# Patient Record
Sex: Male | Born: 1990 | Race: White | Hispanic: No | Marital: Single | Smoking: Never smoker
Health system: Southern US, Community
[De-identification: ages and names within clinical notes are randomized; demographics above are authoritative.]

## PROBLEM LIST (undated history)

## (undated) DIAGNOSIS — E7800 Pure hypercholesterolemia, unspecified: Secondary | ICD-10-CM

## (undated) DIAGNOSIS — J45909 Unspecified asthma, uncomplicated: Secondary | ICD-10-CM

## (undated) DIAGNOSIS — I10 Essential (primary) hypertension: Secondary | ICD-10-CM

## (undated) DIAGNOSIS — R480 Dyslexia and alexia: Secondary | ICD-10-CM

## (undated) DIAGNOSIS — R278 Other lack of coordination: Secondary | ICD-10-CM

## (undated) DIAGNOSIS — F988 Other specified behavioral and emotional disorders with onset usually occurring in childhood and adolescence: Secondary | ICD-10-CM

## (undated) HISTORY — DX: Pure hypercholesterolemia, unspecified: E78.00

## (undated) HISTORY — DX: Other specified behavioral and emotional disorders with onset usually occurring in childhood and adolescence: F98.8

## (undated) HISTORY — PX: MOUTH SURGERY: SHX715

## (undated) HISTORY — DX: Dyslexia and alexia: R48.0

## (undated) HISTORY — DX: Essential (primary) hypertension: I10

## (undated) HISTORY — DX: Other lack of coordination: R27.8

## (undated) HISTORY — DX: Unspecified asthma, uncomplicated: J45.909

---
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
## 2011-06-11 LAB — RAPID URINE DRUG SCREEN, HOSP PERFORMED
Opiates: NOT DETECTED
Tetrahydrocannabinol: NOT DETECTED

## 2015-12-21 DIAGNOSIS — F4323 Adjustment disorder with mixed anxiety and depressed mood: Secondary | ICD-10-CM | POA: Diagnosis not present

## 2015-12-28 DIAGNOSIS — F4323 Adjustment disorder with mixed anxiety and depressed mood: Secondary | ICD-10-CM | POA: Diagnosis not present

## 2016-01-25 DIAGNOSIS — F4323 Adjustment disorder with mixed anxiety and depressed mood: Secondary | ICD-10-CM | POA: Diagnosis not present

## 2016-02-09 DIAGNOSIS — F4323 Adjustment disorder with mixed anxiety and depressed mood: Secondary | ICD-10-CM | POA: Diagnosis not present

## 2016-02-15 DIAGNOSIS — F988 Other specified behavioral and emotional disorders with onset usually occurring in childhood and adolescence: Secondary | ICD-10-CM | POA: Diagnosis not present

## 2016-02-23 DIAGNOSIS — F4323 Adjustment disorder with mixed anxiety and depressed mood: Secondary | ICD-10-CM | POA: Diagnosis not present

## 2016-03-15 DIAGNOSIS — F4323 Adjustment disorder with mixed anxiety and depressed mood: Secondary | ICD-10-CM | POA: Diagnosis not present

## 2016-04-10 DIAGNOSIS — F4323 Adjustment disorder with mixed anxiety and depressed mood: Secondary | ICD-10-CM | POA: Diagnosis not present

## 2016-04-24 DIAGNOSIS — F4323 Adjustment disorder with mixed anxiety and depressed mood: Secondary | ICD-10-CM | POA: Diagnosis not present

## 2016-05-17 DIAGNOSIS — F4323 Adjustment disorder with mixed anxiety and depressed mood: Secondary | ICD-10-CM | POA: Diagnosis not present

## 2016-06-14 DIAGNOSIS — F4323 Adjustment disorder with mixed anxiety and depressed mood: Secondary | ICD-10-CM | POA: Diagnosis not present

## 2016-07-04 ENCOUNTER — Telehealth: Payer: Self-pay

## 2016-07-04 NOTE — Telephone Encounter (Signed)
Records placed in your folder for review. Pt has NP appt with you tomorrow.

## 2016-07-05 ENCOUNTER — Encounter: Payer: Self-pay | Admitting: Medical

## 2016-07-05 ENCOUNTER — Ambulatory Visit (INDEPENDENT_AMBULATORY_CARE_PROVIDER_SITE_OTHER): Payer: BLUE CROSS/BLUE SHIELD | Admitting: Medical

## 2016-07-05 VITALS — BP 116/72 | HR 122 | Temp 98.1°F | Ht 70.0 in | Wt 213.4 lb

## 2016-07-05 DIAGNOSIS — F988 Other specified behavioral and emotional disorders with onset usually occurring in childhood and adolescence: Secondary | ICD-10-CM

## 2016-07-05 DIAGNOSIS — R000 Tachycardia, unspecified: Secondary | ICD-10-CM | POA: Diagnosis not present

## 2016-07-05 DIAGNOSIS — F4323 Adjustment disorder with mixed anxiety and depressed mood: Secondary | ICD-10-CM | POA: Diagnosis not present

## 2016-07-05 MED ORDER — AMPHETAMINE-DEXTROAMPHET ER 30 MG PO CP24: 60.0000 mg | ORAL_CAPSULE | Freq: Every day | ORAL | 0 refills | Status: DC

## 2016-07-05 NOTE — Progress Notes (Signed)
Subjective: Chief Complaint  Patient presents with  . New Patient (Initial Visit)    establish care for Adderall management   Here as a new patient.  Saw Dr. Rae LipsShuler for pediatrician, then Dr. Rosezetta SchlatterBurnette until he retired retired recently. Wanted to change providers as he didn't like the office management and front office staff, rudeness.  Anthony Pena, his friend and patient of Dr. Susann GivensLalonde here recommend him come here.    He notes he was originally diagnosed with ADD as probably age 89 or 25yo ,by Dr. Jane CanarySharpless here in [place].  Has been on various medication over the years.   Was on Ritalin at 25yo.  Took Ritalin for years.  Changed to concertra in middle school, was on Focalin for a period of time.  Switched to Adderall in high school, did better than all prior medication.   Been on current regimen of Adderall XR 30mg  2 tablets daily in the morning, and occasionally uses regular release 10mg  for extra long days or assignments.   Does hasn't changed in 7-8 years.  Only heart disease in family is PGF with CHF.  He uses fit bit and denies regularly tachycardia.  Pulse usually in the 75bpm region.    Coffee in the morning.    otherwise no c/o.  Is a businessman, does e commerce with mattress and other.    Past Medical History:  Diagnosis Date  . ADD (attention deficit disorder)    No current outpatient prescriptions on file prior to visit.   No current facility-administered medications on file prior to visit.     ROS as in subjective   Objective: BP 116/72 (BP Location: Right Arm, Patient Position: Sitting, Cuff Size: Large)   Pulse (!) 122   Temp 98.1 F (36.7 C) (Oral)   Ht 5\' 10"  (1.778 m)   Wt 213 lb 6.4 oz (96.8 kg)   SpO2 98%   BMI 30.62 kg/m   General appearance: alert, no distress, WD/WN,  Neck: supple, no lymphadenopathy, no thyromegaly, no masses Heart: RRR, normal S1, S2, no murmurs Lungs: CTA bilaterally, no wheezes, rhonchi, or rales Pulses: 2+ symmetric, upper and  lower extremities, normal cap refill Ext: no edema    Assessment: Encounter Diagnoses  Name Primary?  . Attention deficit disorder, unspecified hyperactivity presence Yes  . Tachycardia     Plan Reviewed prior records,for now c/t same medication but discussed possibly trying something lower dose in the future particularly if he continues to have tachycardia. Plan physical and labs and EKG next visit.    [name] was seen today for new patient (initial visit).  Diagnoses and all orders for this visit:  Attention deficit disorder, unspecified hyperactivity presence  Tachycardia  Other orders -     Discontinue: amphetamine-dextroamphetamine (ADDERALL XR) 30 MG 24 hr capsule; Take 2 capsules (60 mg total) by mouth daily. -     amphetamine-dextroamphetamine (ADDERALL XR) 30 MG 24 hr capsule; Take 2 capsules (60 mg total) by mouth daily. -     amphetamine-dextroamphetamine (ADDERALL XR) 30 MG 24 hr capsule; Take 2 capsules (60 mg total) by mouth daily.

## 2016-08-07 DIAGNOSIS — F4323 Adjustment disorder with mixed anxiety and depressed mood: Secondary | ICD-10-CM | POA: Diagnosis not present

## 2016-09-24 ENCOUNTER — Telehealth: Payer: Self-pay | Admitting: Medical

## 2016-09-24 NOTE — Telephone Encounter (Addendum)
P.A. ADDERALL XR submitted online

## 2016-09-24 NOTE — Telephone Encounter (Signed)
Also called [employer] t# [phone] to follow up & was advised that also information was received and should have decision by 09/26/16.  Left message for pt

## 2016-09-25 NOTE — Telephone Encounter (Signed)
P.A. Approved til 09/25/19, Pt informed. Faxed pharmacy

## 2016-10-25 ENCOUNTER — Telehealth: Payer: Self-pay | Admitting: Medical

## 2016-10-25 NOTE — Telephone Encounter (Signed)
Pt called for refills of adderall/call [phone] when ready

## 2016-10-26 ENCOUNTER — Other Ambulatory Visit: Payer: Self-pay | Admitting: Medical

## 2016-10-26 MED ORDER — AMPHETAMINE-DEXTROAMPHET ER 30 MG PO CP24: 60.0000 mg | ORAL_CAPSULE | Freq: Every day | ORAL | 0 refills | Status: DC

## 2016-10-26 NOTE — Telephone Encounter (Signed)
We have just seen for initial visit.  Get him back in for f/u.  If needed, can make this a physical visit.  5mo supply printed

## 2016-10-29 NOTE — Telephone Encounter (Signed)
Lm script ready for pick up

## 2016-11-21 ENCOUNTER — Ambulatory Visit (INDEPENDENT_AMBULATORY_CARE_PROVIDER_SITE_OTHER): Payer: BLUE CROSS/BLUE SHIELD | Admitting: Medical

## 2016-11-21 ENCOUNTER — Encounter: Payer: Self-pay | Admitting: Medical

## 2016-11-21 VITALS — BP 130/88 | HR 106 | Wt 218.8 lb

## 2016-11-21 DIAGNOSIS — Z79899 Other long term (current) drug therapy: Secondary | ICD-10-CM | POA: Insufficient documentation

## 2016-11-21 DIAGNOSIS — F988 Other specified behavioral and emotional disorders with onset usually occurring in childhood and adolescence: Secondary | ICD-10-CM

## 2016-11-21 DIAGNOSIS — R000 Tachycardia, unspecified: Secondary | ICD-10-CM | POA: Diagnosis not present

## 2016-11-21 MED ORDER — AMPHETAMINE-DEXTROAMPHET ER 30 MG PO CP24: 60.0000 mg | ORAL_CAPSULE | Freq: Every day | ORAL | 0 refills | Status: DC
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
## 2017-01-30 ENCOUNTER — Telehealth: Payer: Self-pay | Admitting: Family Medicine

## 2017-01-30 NOTE — Telephone Encounter (Signed)
Pt came in on march 7 and received Adderall XR refills. The last one he received should have been dated for 01/21/2017. It was written in error for 02/21/2017. He needs one written for may. [facility] [facility] to [facility] being out. Please call pt at [phone] when ready.

## 2017-01-31 MED ORDER — AMPHETAMINE-DEXTROAMPHET ER 30 MG PO CP24: 60.0000 mg | ORAL_CAPSULE | Freq: Every day | ORAL | 0 refills | Status: DC

## 2017-03-13 ENCOUNTER — Encounter (HOSPITAL_COMMUNITY): Payer: Self-pay | Admitting: Emergency Medicine

## 2017-03-13 ENCOUNTER — Emergency Department (HOSPITAL_COMMUNITY)
Admission: EM | Admit: 2017-03-13 | Discharge: 2017-03-13 | Disposition: A | Discharge status: Home / Self Care | Payer: BLUE CROSS/BLUE SHIELD | Attending: Emergency Medicine | Admitting: Emergency Medicine

## 2017-03-13 DIAGNOSIS — Y939 Activity, unspecified: Secondary | ICD-10-CM | POA: Insufficient documentation

## 2017-03-13 DIAGNOSIS — T310 Burns involving less than 10% of body surface: Secondary | ICD-10-CM | POA: Diagnosis not present

## 2017-03-13 DIAGNOSIS — Z79899 Other long term (current) drug therapy: Secondary | ICD-10-CM | POA: Insufficient documentation

## 2017-03-13 DIAGNOSIS — Y999 Unspecified external cause status: Secondary | ICD-10-CM | POA: Insufficient documentation

## 2017-03-13 DIAGNOSIS — T24291A Burn of second degree of multiple sites of right lower limb, except ankle and foot, initial encounter: Secondary | ICD-10-CM | POA: Diagnosis not present

## 2017-03-13 DIAGNOSIS — T24231A Burn of second degree of right lower leg, initial encounter: Secondary | ICD-10-CM | POA: Diagnosis not present

## 2017-03-13 DIAGNOSIS — Y929 Unspecified place or not applicable: Secondary | ICD-10-CM | POA: Insufficient documentation

## 2017-03-13 DIAGNOSIS — X58XXXA Exposure to other specified factors, initial encounter: Secondary | ICD-10-CM | POA: Diagnosis not present

## 2017-03-13 DIAGNOSIS — S79821A Other specified injuries of right thigh, initial encounter: Secondary | ICD-10-CM | POA: Diagnosis not present

## 2017-03-13 MED ORDER — SILVER SULFADIAZINE 1 % EX CREA: 1.0000 "application " | TOPICAL_CREAM | Freq: Every day | CUTANEOUS | 0 refills | Status: DC

## 2017-03-13 MED ORDER — SILVER SULFADIAZINE 1 % EX CREA
TOPICAL_CREAM | Freq: Every day | CUTANEOUS | Status: DC
  Administered 2017-03-13: 17:00:00 via TOPICAL
  Filled 2017-03-13: qty 85
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
## 2017-03-13 NOTE — ED Notes (Signed)
Wound cleansed 

## 2017-03-13 NOTE — Discharge Instructions (Signed)
Keep wound clean - wash daily Apply Silvadene cream 1-2 times daily and apply a new dressing Follow up with Dr. Adrienne MochaSanger-Dillingham (Plastics) Return for worsening symptoms
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
## 2017-03-22 DIAGNOSIS — T24211A Burn of second degree of right thigh, initial encounter: Secondary | ICD-10-CM | POA: Diagnosis not present

## 2017-03-26 DIAGNOSIS — T24211A Burn of second degree of right thigh, initial encounter: Secondary | ICD-10-CM | POA: Diagnosis not present

## 2017-04-01 DIAGNOSIS — T24211A Burn of second degree of right thigh, initial encounter: Secondary | ICD-10-CM | POA: Diagnosis not present

## 2017-04-02 ENCOUNTER — Other Ambulatory Visit: Payer: Self-pay | Admitting: Medical

## 2017-04-02 ENCOUNTER — Telehealth: Payer: Self-pay | Admitting: Medical

## 2017-04-02 MED ORDER — AMPHETAMINE-DEXTROAMPHET ER 30 MG PO CP24: 60.0000 mg | ORAL_CAPSULE | Freq: Every day | ORAL | 0 refills | Status: DC

## 2017-04-02 NOTE — Telephone Encounter (Signed)
rx ready for 2 mo supply.  Scheduled physical and lab visit for about 74mo from now (partly due to monitor elevated heart rate).

## 2017-04-02 NOTE — Telephone Encounter (Signed)
Pt requesting refill on Adderall. Call pt at [phone] when script is ready for pick up

## 2017-05-29 ENCOUNTER — Encounter: Payer: BLUE CROSS/BLUE SHIELD | Admitting: Medical

## 2017-05-30 ENCOUNTER — Encounter: Payer: Self-pay | Admitting: Medical

## 2017-06-10 ENCOUNTER — Telehealth: Payer: Self-pay | Admitting: Medical

## 2017-06-10 ENCOUNTER — Other Ambulatory Visit: Payer: Self-pay | Admitting: Medical

## 2017-06-10 MED ORDER — AMPHETAMINE-DEXTROAMPHET ER 30 MG PO CP24: 60.0000 mg | ORAL_CAPSULE | Freq: Every day | ORAL | 0 refills | Status: DC

## 2017-06-10 NOTE — Telephone Encounter (Signed)
rx ready 

## 2017-06-10 NOTE — Telephone Encounter (Signed)
Pt called and is requesting a refill on his adderrall pt made a cpe appt for 07-03-2017, he wants to know if you would please right him a a month supply till his appt, pt can be reached at [phone]

## 2017-06-10 NOTE — Telephone Encounter (Signed)
Informed pt that rx was ready to be picked up °

## 2017-07-03 ENCOUNTER — Encounter: Payer: Self-pay | Admitting: Medical

## 2017-07-03 ENCOUNTER — Ambulatory Visit (INDEPENDENT_AMBULATORY_CARE_PROVIDER_SITE_OTHER): Payer: BLUE CROSS/BLUE SHIELD | Admitting: Medical

## 2017-07-03 VITALS — BP 124/90 | HR 101 | Ht 69.5 in | Wt 214.8 lb

## 2017-07-03 DIAGNOSIS — Z113 Encounter for screening for infections with a predominantly sexual mode of transmission: Secondary | ICD-10-CM | POA: Diagnosis not present

## 2017-07-03 DIAGNOSIS — F988 Other specified behavioral and emotional disorders with onset usually occurring in childhood and adolescence: Secondary | ICD-10-CM

## 2017-07-03 DIAGNOSIS — Z0000 Encounter for general adult medical examination without abnormal findings: Secondary | ICD-10-CM

## 2017-07-03 DIAGNOSIS — R000 Tachycardia, unspecified: Secondary | ICD-10-CM

## 2017-07-03 DIAGNOSIS — Z79899 Other long term (current) drug therapy: Secondary | ICD-10-CM

## 2017-07-03 DIAGNOSIS — R030 Elevated blood-pressure reading, without diagnosis of hypertension: Secondary | ICD-10-CM | POA: Diagnosis not present

## 2017-07-03 MED ORDER — AMPHETAMINE-DEXTROAMPHET ER 30 MG PO CP24: 60.0000 mg | ORAL_CAPSULE | Freq: Every day | ORAL | 0 refills | Status: DC

## 2017-07-03 NOTE — Patient Instructions (Addendum)
Please monitor your pulse and blood pressures, write them down, and fax me a list of the readings in a few weeks.      Health Maintenance, Male A healthy lifestyle and preventive care is important for your health and wellness. Ask your health care provider about what schedule of regular examinations is right for you. What should I know about weight and diet? Eat a Healthy Diet  Eat plenty of vegetables, fruits, whole grains, low-fat dairy products, and lean protein.  Do not eat a lot of foods high in solid fats, added sugars, or salt.  Maintain a Healthy Weight Regular exercise can help you achieve or maintain a healthy weight. You should:  Do at least 150 minutes of exercise each week. The exercise should increase your heart rate and make you sweat (moderate-intensity exercise).  Do strength-training exercises at least twice a week.  Watch Your Levels of Cholesterol and Blood Lipids  Have your blood tested for lipids and cholesterol every 5 years starting at 28 years of age. If you are at high risk for heart disease, you should start having your blood tested when you are 28 years old. You may need to have your cholesterol levels checked more often if: ? Your lipid or cholesterol levels are high. ? You are older than 28 years of age. ? You are at high risk for heart disease.  What should I know about cancer screening? Many types of cancers can be detected early and may often be prevented. Lung Cancer  You should be screened every year for lung cancer if: ? You are a current smoker who has smoked for at least 30 years. ? You are a former smoker who has quit within the past 15 years.  Talk to your health care provider about your screening options, when you should start screening, and how often you should be screened.  Colorectal Cancer  Routine colorectal cancer screening usually begins at 28 years of age and should be repeated every 5-10 years until you are 28 years old. You may  need to be screened more often if early forms of precancerous polyps or small growths are found. Your health care provider may recommend screening at an earlier age if you have risk factors for colon cancer.  Your health care provider may recommend using home test kits to check for hidden blood in the stool.  A small camera at the end of a tube can be used to examine your colon (sigmoidoscopy or colonoscopy). This checks for the earliest forms of colorectal cancer.  Prostate and Testicular Cancer  Depending on your age and overall health, your health care provider may do certain tests to screen for prostate and testicular cancer.  Talk to your health care provider about any symptoms or concerns you have about testicular or prostate cancer.  Skin Cancer  Check your skin from head to toe regularly.  Tell your health care provider about any new moles or changes in moles, especially if: ? There is a change in a mole's size, shape, or color. ? You have a mole that is larger than a pencil eraser.  Always use sunscreen. Apply sunscreen liberally and repeat throughout the day.  Protect yourself by wearing long sleeves, pants, a wide-brimmed hat, and sunglasses when outside.  What should I know about heart disease, diabetes, and high blood pressure?  If you are 4718-28 years of age, have your blood pressure checked every 3-5 years. If you are 28 years of age or  older, have your blood pressure checked every year. You should have your blood pressure measured twice-once when you are at a hospital or clinic, and once when you are not at a hospital or clinic. Record the average of the two measurements. To check your blood pressure when you are not at a hospital or clinic, you can use: ? An automated blood pressure machine at a pharmacy. ? A home blood pressure monitor.  Talk to your health care provider about your target blood pressure.  If you are between 66-58 years old, ask your health care  provider if you should take aspirin to prevent heart disease.  Have regular diabetes screenings by checking your fasting blood sugar level. ? If you are at a normal weight and have a low risk for diabetes, have this test once every three years after the age of 65. ? If you are overweight and have a high risk for diabetes, consider being tested at a younger age or more often.  A one-time screening for abdominal aortic aneurysm (AAA) by ultrasound is recommended for men aged 5-75 years who are current or former smokers. What should I know about preventing infection? Hepatitis B If you have a higher risk for hepatitis B, you should be screened for this virus. Talk with your health care provider to find out if you are at risk for hepatitis B infection. Hepatitis C Blood testing is recommended for:  Everyone born from [birth year] through 1967.  Anyone with known risk factors for hepatitis C.  Sexually Transmitted Diseases (STDs)  You should be screened each year for STDs including gonorrhea and chlamydia if: ? You are sexually active and are younger than 28 years of age. ? You are older than 28 years of age and your health care provider tells you that you are at risk for this type of infection. ? Your sexual activity has changed since you were last screened and you are at an increased risk for chlamydia or gonorrhea. Ask your health care provider if you are at risk.  Talk with your health care provider about whether you are at high risk of being infected with HIV. Your health care provider may recommend a prescription medicine to help prevent HIV infection.  What else can I do?  Schedule regular health, dental, and eye exams.  Stay current with your vaccines (immunizations).  Do not use any tobacco products, such as cigarettes, chewing tobacco, and e-cigarettes. If you need help quitting, ask your health care provider.  Limit alcohol intake to no more than 2 drinks per day. One drink equals 12  ounces of beer, 5 ounces of wine, or 1 ounces of hard liquor.  Do not use street drugs.  Do not share needles.  Ask your health care provider for help if you need support or information about quitting drugs.  Tell your health care provider if you often feel depressed.  Tell your health care provider if you have ever been abused or do not feel safe at home. This information is not intended to replace advice given to you by your health care provider. Make sure you discuss any questions you have with your health care provider. Document Released: 03/01/2008 Document Revised: 05/02/2016 Document Reviewed: 06/07/2015 Elsevier Interactive Patient Education  [employer].
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
## 2017-07-04 LAB — CBC WITH DIFFERENTIAL/PLATELET
BASOS ABS: 41 {cells}/uL (ref 0–200)
BASOS PCT: 0.8 %
EOS ABS: 102 {cells}/uL (ref 15–500)
EOS PCT: 2 %
HCT: 48.1 % (ref 38.5–50.0)
Hemoglobin: 17.1 g/dL (ref 13.2–17.1)
Lymphs Abs: 1030 cells/uL (ref 850–3900)
MCH: 32.6 pg (ref 27.0–33.0)
MCHC: 35.6 g/dL (ref 32.0–36.0)
MCV: 91.8 fL (ref 80.0–100.0)
MONOS PCT: 9.1 %
MPV: 9.8 fL (ref 7.5–12.5)
Neutro Abs: 3463 cells/uL (ref 1500–7800)
Neutrophils Relative %: 67.9 %
PLATELETS: 323 10*3/uL (ref 140–400)
RBC: 5.24 10*6/uL (ref 4.20–5.80)
RDW: 12 % (ref 11.0–15.0)
TOTAL LYMPHOCYTE: 20.2 %
WBC mixed population: 464 cells/uL (ref 200–950)
WBC: 5.1 10*3/uL (ref 3.8–10.8)

## 2017-07-04 LAB — COMPREHENSIVE METABOLIC PANEL
AG RATIO: 2 (calc) (ref 1.0–2.5)
ALT: 66 U/L — ABNORMAL HIGH (ref 9–46)
AST: 34 U/L (ref 10–40)
Albumin: 4.7 g/dL (ref 3.6–5.1)
Alkaline phosphatase (APISO): 61 U/L (ref 40–115)
BUN: 12 mg/dL (ref 7–25)
CHLORIDE: 100 mmol/L (ref 98–110)
CO2: 28 mmol/L (ref 20–32)
CREATININE: 0.92 mg/dL (ref 0.60–1.35)
Calcium: 9.8 mg/dL (ref 8.6–10.3)
GLOBULIN: 2.3 g/dL (ref 1.9–3.7)
GLUCOSE: 97 mg/dL (ref 65–99)
Potassium: 4.2 mmol/L (ref 3.5–5.3)
Sodium: 137 mmol/L (ref 135–146)
TOTAL PROTEIN: 7 g/dL (ref 6.1–8.1)
Total Bilirubin: 0.9 mg/dL (ref 0.2–1.2)

## 2017-07-04 LAB — C. TRACHOMATIS/N. GONORRHOEAE RNA
C. TRACHOMATIS RNA, TMA: NOT DETECTED
N. gonorrhoeae RNA, TMA: NOT DETECTED

## 2017-07-04 LAB — LIPID PANEL
CHOL/HDL RATIO: 4.6 (calc) (ref <5.0)
Cholesterol: 215 mg/dL — ABNORMAL HIGH (ref <200)
HDL: 47 mg/dL (ref >40)
LDL Cholesterol (Calc): 145 mg/dL (calc) — ABNORMAL HIGH
NON-HDL CHOLESTEROL (CALC): 168 mg/dL — AB (ref <130)
Triglycerides: 117 mg/dL (ref <150)

## 2017-07-04 LAB — TSH: TSH: 3.23 m[IU]/L (ref 0.40–4.50)

## 2017-07-04 LAB — RPR: RPR: NONREACTIVE

## 2017-07-04 LAB — HIV ANTIBODY (ROUTINE TESTING W REFLEX): HIV: NONREACTIVE

## 2017-09-24 ENCOUNTER — Ambulatory Visit: Payer: Self-pay | Admitting: Medical

## 2017-10-01 ENCOUNTER — Telehealth: Payer: Self-pay | Admitting: Medical

## 2017-10-01 NOTE — Telephone Encounter (Signed)
Pt called and is needing a refill on his adderall pt uses [facility] [phone] - [place], [place] - [address] [facility] [address] pt can be reached at [phone]

## 2017-10-01 NOTE — Telephone Encounter (Signed)
Needs appt.    From his 06/2017 physical , his pulse and BPs continued to be elevated.   He was advised to record and monitor these and bring in the numbers upon recheck visit, particularly in light of his medications that can contribute to the elevated pulse for example.     He apparently NO SHOWED the other day.   Generall with ADD medication we see people in f/u typically every 3 - 6 months depending upon how stable they are on the medication or whether or not there are any other conditions we are monitoring such as the pulse/BP.

## 2017-10-02 ENCOUNTER — Other Ambulatory Visit: Payer: Self-pay | Admitting: Medical

## 2017-10-02 MED ORDER — AMPHETAMINE-DEXTROAMPHET ER 30 MG PO CP24: 60.0000 mg | ORAL_CAPSULE | Freq: Every day | ORAL | 0 refills | Status: DC

## 2017-10-02 NOTE — Telephone Encounter (Signed)
Refill x 19mo ready, make sure he gets 2nd no show notice

## 2017-10-02 NOTE — Telephone Encounter (Signed)
Called  Pt made him an appt  For 10/24/2017 , he was not able to come any sooner because he is out of town because  Work.

## 2017-10-09 ENCOUNTER — Encounter: Payer: Self-pay | Admitting: Medical

## 2017-10-09 NOTE — Telephone Encounter (Signed)
2nd no show letter sent °

## 2017-10-13 ENCOUNTER — Telehealth: Payer: Self-pay | Admitting: Medical

## 2017-10-13 NOTE — Telephone Encounter (Signed)
P.A. ADDERALL XR  

## 2017-10-24 ENCOUNTER — Encounter: Payer: Self-pay | Admitting: Medical

## 2017-10-29 ENCOUNTER — Encounter: Payer: Self-pay | Admitting: Medical

## 2017-10-29 ENCOUNTER — Ambulatory Visit: Payer: BLUE CROSS/BLUE SHIELD | Admitting: Medical

## 2017-10-29 VITALS — BP 132/72 | HR 79 | Wt 219.6 lb

## 2017-10-29 DIAGNOSIS — F988 Other specified behavioral and emotional disorders with onset usually occurring in childhood and adolescence: Secondary | ICD-10-CM

## 2017-10-29 DIAGNOSIS — R000 Tachycardia, unspecified: Secondary | ICD-10-CM

## 2017-10-29 DIAGNOSIS — R7989 Other specified abnormal findings of blood chemistry: Secondary | ICD-10-CM | POA: Insufficient documentation

## 2017-10-29 DIAGNOSIS — Z79899 Other long term (current) drug therapy: Secondary | ICD-10-CM | POA: Diagnosis not present

## 2017-10-29 DIAGNOSIS — R945 Abnormal results of liver function studies: Secondary | ICD-10-CM | POA: Diagnosis not present

## 2017-10-29 DIAGNOSIS — R030 Elevated blood-pressure reading, without diagnosis of hypertension: Secondary | ICD-10-CM

## 2017-10-29 MED ORDER — AMPHETAMINE-DEXTROAMPHET ER 30 MG PO CP24: 60.0000 mg | ORAL_CAPSULE | Freq: Every day | ORAL | 0 refills | Status: DC

## 2017-10-29 MED ORDER — AMPHETAMINE-DEXTROAMPHETAMINE 10 MG PO TABS: 10.0000 mg | ORAL_TABLET | Freq: Every day | ORAL | 0 refills | Status: DC

## 2017-10-29 MED ORDER — AMPHETAMINE-DEXTROAMPHET ER 30 MG PO CP24
60.0000 mg | ORAL_CAPSULE | Freq: Every day | ORAL | 0 refills | Status: DC
Start: 2017-12-27 — End: 2018-01-28

## 2017-10-29 NOTE — Assessment & Plan Note (Signed)
Improved today.  Continue to monitor.

## 2017-10-29 NOTE — Patient Instructions (Signed)
Encounter Diagnoses  Name Primary?  . Elevated blood-pressure reading without diagnosis of hypertension Yes  . Tachycardia   . High risk medication use   . Attention deficit disorder, unspecified hyperactivity presence     Glad to see your blood pressure and pulse was normal today!  Try to get exercise such as walking, biking, or running most days per week.  Work on ab crunches and back strengthening exercising as well as stretching routine daily  Limit salt, fast food, fried food, processed foods and junk food  Try and limit alcohol to 1-2 drinks or less on any given day.  Plan to return for lab visit in 4 months at your convenience.   I recommend a healthy diet.    Do's:   whole grains such as whole grain pasta, rice, whole grains breads and whole grain cereals.  Use small quantities such as 1/2 cup per serving or 2 slices of bread per serving.    Eat 3-5 fruits daily  Eat beans at least once daily  Eat almonds in small quantities at least 3 days per week    If they eat meat, I recommend small portions of lean meats such as chicken, fish, and [place].  Eat as much NON corn and NON potato vegetables as they like, particularly raw or steamed  Drink several large glasses of water daily  Cautions:  Limit red meat  Limit corn and potatoes  Limit sweets, cake, pie, candy  Limit beer and alcohol  Avoid fried food, fast food, large portions  Avoid sugary drinks such as regular soda and sweet tea

## 2017-10-29 NOTE — Assessment & Plan Note (Signed)
Improved.  He has been working on [employer] diet and exercise.

## 2017-10-29 NOTE — Progress Notes (Signed)
Subjective:  Anthony Pena is a 27 y.o. male who presents for med check. Chief Complaint  Patient presents with  . med check    med check n o other concerns , fasting for lab   At his last visit for physical in October, we discussed exam findings on several visits of elevated BP and pulse.   He is on ADD medication/stimulants.   He also was found to have elevated cholesterol and elevated liver tests last visit.   Last visit his dad had recently passed away, was not eating well.   Since last visit he has done a lot better on diet.    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   Has had some pain in shoulder blade, been seeing chiropractor.    Chiropractor is having him work on exercise and core strengthening.   Lately not able to do as much cardio.    Plans to do more in the summer.   No hx/o elevated liver test prior to last visit here, no hx/o hepatitis exposure.  No specific bodily fluid exposures on the job.   Last visit he was drinking heavily, has cut back on this, but typically drinks 1-2 drinks per night.  This past weekend was drinking more with friends.   He notes hx/o Hep B and Hep A vaccination.  Had uncle who died of hepatitis.     ADD - doing well on current medication but in the past his prior PCP had given 10mg  regular release Adderall that he would sometimes use on the weekends instead of the XR dosing.   He would like this for prn use.  A script would last months.    No other aggravating or relieving factors.    No other c/o.  The following portions of the patient's history were reviewed and updated as appropriate: allergies, current medications, past family history, past medical history, past social history, past surgical history and problem list.  ROS Otherwise as in subjective above    Objective BP 132/72   Pulse 79   Wt 219 lb 9.6 oz (99.6 kg)   SpO2 98%   BMI 31.96 kg/m   BP Readings from Last 3 Encounters:  10/29/17 132/72  07/03/17 124/90  03/13/17  (!) 156/100   Wt Readings from Last 3 Encounters:  10/29/17 219 lb 9.6 oz (99.6 kg)  07/03/17 214 lb 12.8 oz (97.4 kg)  11/21/16 218 lb 12.8 oz (99.2 kg)    General appearance: alert, no distress, WD/WN Psych: pleasant, good eye contact, answers questions appropriately    Assessment: Encounter Diagnoses  Name Primary?  . Elevated blood-pressure reading without diagnosis of hypertension Yes  . Tachycardia   . High risk medication use   . Attention deficit disorder, unspecified hyperactivity presence   . Elevated LFTs     Plan: Attention deficit disorder Discussed risks and benefits of medication Medication changes today: c/t Adderall XR, but can use regular release 10mg  prn in place of the XR dosing.  otherwise continue current regimen. I recommended them having a consistent day to day routine with work and household responsibilities  Counseled on health diet, limiting alcohol, caffeine, sugary drinks and avoid energy drinks Counseled on time management, setting realistic goals, using a calendar, organizational tools They have been advised to protect their medication, not let others use their medication, to use as directed and discussed, and that this type of medication is a controlled substance.  Thus, we will continue to see  them for routine medication check ups and monitoring.    Elevated blood-pressure reading without diagnosis of hypertension Improved.  He has been working on [employer]healthier diet and exercise.  Tachycardia Improved today.   Continue to monitor  Elevated LFTs We discussed the recent elevated liver test.  We discussed possible causes of elevated liver test including hepatitis infection, fatty liver disease, medication or drug induced hepatitis, autoimmune hepatitis,iron overload disease, other specific liver diseases.    They decline history of hepatitis exposure They do report regular alcohol use.  They deny heavy Tylenol use They deny history of IV drug  use They deny prior sexual contacts with HIV or hepatitis We discussed potential additional evaluation.  His mildly elevated liver tests are likely due to recent alcohol use.  Counseled on alcohol use, healthy diet, exercise, and we will plan to repeat the liver test in 3-4 months nurse visit.  [name] was seen today for med check.  Diagnoses and all orders for this visit:  Elevated blood-pressure reading without diagnosis of hypertension  Tachycardia  High risk medication use  Attention deficit disorder, unspecified hyperactivity presence  Elevated LFTs -     Hepatic function panel; Future  Other orders -     amphetamine-dextroamphetamine (ADDERALL XR) 30 MG 24 hr capsule; Take 2 capsules (60 mg total) by mouth daily. -     amphetamine-dextroamphetamine (ADDERALL XR) 30 MG 24 hr capsule; Take 2 capsules (60 mg total) by mouth daily. -     amphetamine-dextroamphetamine (ADDERALL XR) 30 MG 24 hr capsule; Take 2 capsules (60 mg total) by mouth daily. -     amphetamine-dextroamphetamine (ADDERALL) 10 MG tablet; Take 1 tablet (10 mg total) by mouth daily with breakfast.   Spent > 30 minutes face to face with patient in discussion of symptoms, evaluation, plan and recommendations.    Follow up: 51mo for lab/nurse visit

## 2017-10-29 NOTE — Assessment & Plan Note (Signed)
Discussed risks and benefits of medication Medication changes today: c/t Adderall XR, but can use regular release 10mg  prn in place of the XR dosing.  otherwise continue current regimen. I recommended them having a consistent day to day routine with work and household responsibilities  Counseled on health diet, limiting alcohol, caffeine, sugary drinks and avoid energy drinks Counseled on time management, setting realistic goals, using a calendar, organizational tools They have been advised to protect their medication, not let others use their medication, to use as directed and discussed, and that this type of medication is a controlled substance.  Thus, we will continue to see them for routine medication check ups and monitoring.

## 2017-10-29 NOTE — Assessment & Plan Note (Signed)
We discussed the recent elevated liver test.  We discussed possible causes of elevated liver test including hepatitis infection, fatty liver disease, medication or drug induced hepatitis, autoimmune hepatitis,iron overload disease, other specific liver diseases.    They decline history of hepatitis exposure They do report regular alcohol use.  They deny heavy Tylenol use They deny history of IV drug use They deny prior sexual contacts with HIV or hepatitis We discussed potential additional evaluation.  His mildly elevated liver tests are likely due to recent alcohol use.  Counseled on alcohol use, healthy diet, exercise, and we will plan to repeat the liver test in 3-4 months nurse visit.

## 2017-11-02 ENCOUNTER — Telehealth: Payer: Self-pay | Admitting: Medical

## 2017-11-02 NOTE — Telephone Encounter (Signed)
P.A. ADDERALL XR with pt's new ins

## 2017-11-02 NOTE — Telephone Encounter (Signed)
P.A. ADDERALL 10MG  with pt's new ins

## 2017-11-02 NOTE — Telephone Encounter (Signed)
P.A. Cancelled pt has new ins.

## 2017-11-03 NOTE — Telephone Encounter (Signed)
P.A. Approved til 09/25/19

## 2017-11-03 NOTE — Telephone Encounter (Signed)
P.A. Approved til 10/28/20

## 2018-01-23 DIAGNOSIS — M9902 Segmental and somatic dysfunction of thoracic region: Secondary | ICD-10-CM | POA: Diagnosis not present

## 2018-01-23 DIAGNOSIS — M609 Myositis, unspecified: Secondary | ICD-10-CM | POA: Diagnosis not present

## 2018-01-23 DIAGNOSIS — M62830 Muscle spasm of back: Secondary | ICD-10-CM | POA: Diagnosis not present

## 2018-01-23 DIAGNOSIS — M9904 Segmental and somatic dysfunction of sacral region: Secondary | ICD-10-CM | POA: Diagnosis not present

## 2018-01-28 ENCOUNTER — Other Ambulatory Visit: Payer: Self-pay | Admitting: Medical

## 2018-01-28 ENCOUNTER — Telehealth: Payer: Self-pay | Admitting: Medical

## 2018-01-28 DIAGNOSIS — M609 Myositis, unspecified: Secondary | ICD-10-CM | POA: Diagnosis not present

## 2018-01-28 DIAGNOSIS — M62830 Muscle spasm of back: Secondary | ICD-10-CM | POA: Diagnosis not present

## 2018-01-28 DIAGNOSIS — M9902 Segmental and somatic dysfunction of thoracic region: Secondary | ICD-10-CM | POA: Diagnosis not present

## 2018-01-28 DIAGNOSIS — M9904 Segmental and somatic dysfunction of sacral region: Secondary | ICD-10-CM | POA: Diagnosis not present

## 2018-01-28 MED ORDER — AMPHETAMINE-DEXTROAMPHET ER 30 MG PO CP24: 60.0000 mg | ORAL_CAPSULE | Freq: Every day | ORAL | 0 refills | Status: DC

## 2018-01-28 NOTE — Telephone Encounter (Signed)
Done, sent electronically

## 2018-01-28 NOTE — Telephone Encounter (Signed)
Pt called and Is requesting a refill on his adderall xr pt uses [id number] - [place], [place] - [address] AT [facility] OF [address] pt can be reached at [phone]

## 2018-01-29 NOTE — Telephone Encounter (Signed)
Called and informed pt that rx was sent to the pharmacy °

## 2018-03-04 ENCOUNTER — Telehealth: Payer: Self-pay | Admitting: Medical

## 2018-03-04 ENCOUNTER — Other Ambulatory Visit: Payer: Self-pay | Admitting: Medical

## 2018-03-04 MED ORDER — AMPHETAMINE-DEXTROAMPHET ER 30 MG PO CP24: 60.0000 mg | ORAL_CAPSULE | Freq: Every day | ORAL | 0 refills | Status: DC

## 2018-03-04 NOTE — Telephone Encounter (Signed)
Pt requesting refill on Adderall   

## 2018-04-01 ENCOUNTER — Other Ambulatory Visit: Payer: Self-pay | Admitting: Medical

## 2018-04-01 ENCOUNTER — Telehealth: Payer: Self-pay | Admitting: Medical

## 2018-04-01 MED ORDER — AMPHETAMINE-DEXTROAMPHET ER 30 MG PO CP24: 60.0000 mg | ORAL_CAPSULE | Freq: Every day | ORAL | 0 refills | Status: DC

## 2018-04-01 NOTE — Telephone Encounter (Signed)
Medication sent, make it recheck appointment on medications before next refill

## 2018-04-01 NOTE — Telephone Encounter (Signed)
Called and left voice mail that rx was sent to the pharmacy and that he needs a follow up on medications

## 2018-04-01 NOTE — Telephone Encounter (Signed)
Pt called and is requesting a refill on his adderall pt is needing them to be filled a little early he is going on vacation and is leaving to go out of town Wednesday 04/02/2018 until next week, [phone] - [place], [place] - [address] [facility] [address]

## 2018-04-25 ENCOUNTER — Ambulatory Visit: Payer: BLUE CROSS/BLUE SHIELD | Admitting: Medical

## 2018-04-25 VITALS — BP 140/90 | HR 100 | Temp 97.7°F | Resp 16 | Ht 71.0 in | Wt 210.8 lb

## 2018-04-25 DIAGNOSIS — R030 Elevated blood-pressure reading, without diagnosis of hypertension: Secondary | ICD-10-CM | POA: Diagnosis not present

## 2018-04-25 DIAGNOSIS — R7989 Other specified abnormal findings of blood chemistry: Secondary | ICD-10-CM

## 2018-04-25 DIAGNOSIS — R945 Abnormal results of liver function studies: Secondary | ICD-10-CM | POA: Diagnosis not present

## 2018-04-25 DIAGNOSIS — Z79899 Other long term (current) drug therapy: Secondary | ICD-10-CM | POA: Diagnosis not present

## 2018-04-25 DIAGNOSIS — F988 Other specified behavioral and emotional disorders with onset usually occurring in childhood and adolescence: Secondary | ICD-10-CM

## 2018-04-25 MED ORDER — AMPHETAMINE-DEXTROAMPHET ER 30 MG PO CP24: 60.0000 mg | ORAL_CAPSULE | Freq: Every day | ORAL | 0 refills | Status: DC

## 2018-04-25 NOTE — Patient Instructions (Addendum)
Check blood pressure and pulses at least 3 days per week for the next 2-3 weeks and call in or fax or e-mail numbers   Normal blood pressure is 120/70.   140/90 or higher is abnormal  Normal pulse should be in the 70-80 range resting.  >100 resting is abnormal   If your numbers are consistently in the abnormal range, we should consider medication such as Atenolol to lower those numbers   DASH Eating Plan DASH stands for "Dietary Approaches to Stop Hypertension." The DASH eating plan is a healthy eating plan that has been shown to reduce high blood pressure (hypertension). Additional health benefits may include reducing the risk of type 2 diabetes mellitus, heart disease, and stroke. The DASH eating plan may also help with weight loss. WHAT DO I NEED TO KNOW ABOUT THE DASH EATING PLAN? For the DASH eating plan, you will follow these general guidelines:  Choose foods with a percent daily value for sodium of less than 5% (as listed on the food label).  Use salt-free seasonings or herbs instead of table salt or sea salt.  Check with your health care provider or pharmacist before using salt substitutes.  Eat lower-sodium products, often labeled as "lower sodium" or "no salt added."  Eat fresh foods.  Eat more vegetables, fruits, and low-fat dairy products.  Choose whole grains. Look for the word "whole" as the first word in the ingredient list.  Choose fish and skinless chicken or [place] more often than red meat. Limit fish, poultry, and meat to 6 oz (170 g) each day.  Limit sweets, desserts, sugars, and sugary drinks.  Choose heart-healthy fats.  Limit cheese to 1 oz (28 g) per day.  Eat more home-cooked food and less restaurant, buffet, and fast food.  Limit fried foods.  Cook foods using methods other than frying.  Limit canned vegetables. If you do use them, rinse them well to decrease the sodium.  When eating at a restaurant, ask that your food be prepared with less  salt, or no salt if possible. WHAT FOODS CAN I EAT? Seek help from a dietitian for individual calorie needs. Grains Whole grain or whole wheat bread. Brown rice. Whole grain or whole wheat pasta. Quinoa, bulgur, and whole grain cereals. Low-sodium cereals. Corn or whole wheat flour tortillas. Whole grain cornbread. Whole grain crackers. Low-sodium crackers. Vegetables Fresh or frozen vegetables (raw, steamed, roasted, or grilled). Low-sodium or reduced-sodium tomato and vegetable juices. Low-sodium or reduced-sodium tomato sauce and paste. Low-sodium or reduced-sodium canned vegetables.  Fruits All fresh, canned (in natural juice), or frozen fruits. Meat and Other Protein Products Ground beef (85% or leaner), grass-fed beef, or beef trimmed of fat. Skinless chicken or [place]. Ground chicken or [place]. Pork trimmed of fat. All fish and seafood. Eggs. Dried beans, peas, or lentils. Unsalted nuts and seeds. Unsalted canned beans. Dairy Low-fat dairy products, such as skim or 1% milk, 2% or reduced-fat cheeses, low-fat ricotta or cottage cheese, or plain low-fat yogurt. Low-sodium or reduced-sodium cheeses. Fats and Oils Tub margarines without trans fats. Light or reduced-fat mayonnaise and salad dressings (reduced sodium). Avocado. Safflower, olive, or canola oils. Natural peanut or almond butter. Other Unsalted popcorn and pretzels. The items listed above may not be a complete list of recommended foods or beverages. Contact your dietitian for more options. WHAT FOODS ARE NOT RECOMMENDED? Grains White bread. White pasta. White rice. Refined cornbread. Bagels and croissants. Crackers that contain trans fat. Vegetables Creamed or fried vegetables. Vegetables in  a cheese sauce. Regular canned vegetables. Regular canned tomato sauce and paste. Regular tomato and vegetable juices. Fruits Dried fruits. Canned fruit in light or heavy syrup. Fruit juice. Meat and Other Protein Products Fatty cuts of  meat. Ribs, chicken wings, bacon, sausage, bologna, salami, chitterlings, fatback, hot dogs, bratwurst, and packaged luncheon meats. Salted nuts and seeds. Canned beans with salt. Dairy Whole or 2% milk, cream, half-and-half, and cream cheese. Whole-fat or sweetened yogurt. Full-fat cheeses or blue cheese. Nondairy creamers and whipped toppings. Processed cheese, cheese spreads, or cheese curds. Condiments Onion and garlic salt, seasoned salt, table salt, and sea salt. Canned and packaged gravies. Worcestershire sauce. Tartar sauce. Barbecue sauce. Teriyaki sauce. Soy sauce, including reduced sodium. Steak sauce. Fish sauce. Oyster sauce. Cocktail sauce. Horseradish. Ketchup and mustard. Meat flavorings and tenderizers. Bouillon cubes. Hot sauce. Tabasco sauce. Marinades. Taco seasonings. Relishes. Fats and Oils Butter, stick margarine, lard, shortening, ghee, and bacon fat. Coconut, palm kernel, or palm oils. Regular salad dressings. Other Pickles and olives. Salted popcorn and pretzels. The items listed above may not be a complete list of foods and beverages to avoid. Contact your dietitian for more information. WHERE CAN I FIND MORE INFORMATION? National Heart, Lung, and Blood Institute: [employer] Document Released: 08/23/2011 Document Revised: 01/18/2014 Document Reviewed: 07/08/2013 [facility] Patient Information 2015 [place], [place]. This information is not intended to replace advice given to you by your health care provider. Make sure you discuss any questions you have with your health care provider.        Why follow it? Research shows. . Those who follow the Mediterranean diet have a reduced risk of heart disease  . The diet is associated with a reduced incidence of Parkinson's and Alzheimer's diseases . People following the diet may have longer life expectancies and lower rates of chronic diseases  . The Dietary Guidelines for Americans  recommends the Mediterranean diet as an eating plan to promote health and prevent disease  What Is the Mediterranean Diet?  . Healthy eating plan based on typical foods and recipes of Mediterranean-style cooking . The diet is primarily a plant based diet; these foods should make up a majority of meals   Starches - Plant based foods should make up a majority of meals - They are an important sources of vitamins, minerals, energy, antioxidants, and fiber - Choose whole grains, foods high in fiber and minimally processed items  - Typical grain sources include wheat, oats, barley, corn, brown rice, bulgar, farro, millet, polenta, couscous  - Various types of beans include chickpeas, lentils, fava beans, black beans, white beans   Fruits  Veggies - Large quantities of antioxidant rich fruits & veggies; 6 or more servings  - Vegetables can be eaten raw or lightly drizzled with oil and cooked  - Vegetables common to the traditional Mediterranean Diet include: artichokes, arugula, beets, broccoli, brussel sprouts, cabbage, carrots, celery, collard greens, cucumbers, eggplant, kale, leeks, lemons, lettuce, mushrooms, okra, onions, peas, peppers, potatoes, pumpkin, radishes, rutabaga, shallots, spinach, sweet potatoes, turnips, zucchini - Fruits common to the Mediterranean Diet include: apples, apricots, avocados, cherries, clementines, dates, figs, grapefruits, grapes, melons, nectarines, oranges, peaches, pears, pomegranates, strawberries, tangerines  Fats - Replace butter and margarine with healthy oils, such as olive oil, canola oil, and tahini  - Limit nuts to no more than a handful a day  - Nuts include walnuts, almonds, pecans, pistachios, pine nuts  - Limit or avoid candied, honey roasted or heavily salted nuts - Olives are central  to the Mediterranean diet - can be eaten whole or used in a variety of dishes   Meats Protein - Limiting red meat: no more than a few times a month - When eating red  meat: choose lean cuts and keep the portion to the size of deck of cards - Eggs: approx. 0 to 4 times a week  - Fish and lean poultry: at least 2 a week  - Healthy protein sources include, chicken, [place], lean beef, lamb - Increase intake of seafood such as tuna, salmon, trout, mackerel, shrimp, scallops - Avoid or limit high fat processed meats such as sausage and bacon  Dairy - Include moderate amounts of low fat dairy products  - Focus on healthy dairy such as fat free yogurt, skim milk, low or reduced fat cheese - Limit dairy products higher in fat such as whole or 2% milk, cheese, ice cream  Alcohol - Moderate amounts of red wine is ok  - No more than 5 oz daily for women (all ages) and men older than age 29  - No more than 10 oz of wine daily for men younger than 6865  Other - Limit sweets and other desserts  - Use herbs and spices instead of salt to flavor foods  - Herbs and spices common to the traditional Mediterranean Diet include: basil, bay leaves, chives, cloves, cumin, fennel, garlic, lavender, marjoram, mint, oregano, parsley, pepper, rosemary, sage, savory, sumac, tarragon, thyme   It's not just a diet, it's a lifestyle:  . The Mediterranean diet includes lifestyle factors typical of those in the region  . Foods, drinks and meals are best eaten with others and savored . Daily physical activity is important for overall good health . This could be strenuous exercise like running and aerobics . This could also be more leisurely activities such as walking, housework, yard-work, or taking the stairs . Moderation is the key; a balanced and healthy diet accommodates most foods and drinks . Consider portion sizes and frequency of consumption of certain foods   Meal Ideas & Options:  . Breakfast:  o Whole wheat toast or whole wheat English muffins with peanut butter & hard boiled egg o Steel cut oats topped with apples & cinnamon and skim milk  o Fresh fruit: banana, strawberries,  melon, berries, peaches  o Smoothies: strawberries, bananas, greek yogurt, peanut butter o Low fat greek yogurt with blueberries and granola  o Egg white omelet with spinach and mushrooms o Breakfast couscous: whole wheat couscous, apricots, skim milk, cranberries  . Sandwiches:  o Hummus and grilled vegetables (peppers, zucchini, squash) on whole wheat bread   o Grilled chicken on whole wheat pita with lettuce, tomatoes, cucumbers or tzatziki  o Tuna salad on whole wheat bread: tuna salad made with greek yogurt, olives, red peppers, capers, green onions o Garlic rosemary lamb pita: lamb sauted with garlic, rosemary, salt & pepper; add lettuce, cucumber, greek yogurt to pita - flavor with lemon juice and black pepper  . Seafood:  o Mediterranean grilled salmon, seasoned with garlic, basil, parsley, lemon juice and black pepper o Shrimp, lemon, and spinach whole-grain pasta salad made with low fat greek yogurt  o Seared scallops with lemon orzo  o Seared tuna steaks seasoned salt, pepper, coriander topped with tomato mixture of olives, tomatoes, olive oil, minced garlic, parsley, green onions and cappers  . Meats:  o Herbed greek chicken salad with kalamata olives, cucumber, feta  o Red bell peppers stuffed with spinach, bulgur,  lean ground beef (or lentils) & topped with feta   o Kebabs: skewers of chicken, tomatoes, onions, zucchini, squash  o [place] burgers: made with red onions, mint, dill, lemon juice, feta cheese topped with roasted red peppers . Vegetarian o Cucumber salad: cucumbers, artichoke hearts, celery, red onion, feta cheese, tossed in olive oil & lemon juice  o Hummus and whole grain pita points with a greek salad (lettuce, tomato, feta, olives, cucumbers, red onion) o Lentil soup with celery, carrots made with vegetable broth, garlic, salt and pepper  o Tabouli salad: parsley, bulgur, mint, scallions, cucumbers, tomato, radishes, lemon juice, olive oil, salt and  pepper.

## 2018-04-25 NOTE — Progress Notes (Signed)
Subjective:  Anthony Pena is a 27 y.o. male who presents for med check. Chief Complaint  Patient presents with  . med check    med check    Here for med check.  ADD - doing well on current medication.  Using 30mg  XR adderall daily.  Work has been crazy stressful.     Hx/o elevated BP - checks occasionally, gets normal BP readings at home.  Pulse readings in 70-80s at rest, home readings.  Here for recheck on elevated liver tests from last year.  No hx/o elevated liver test prior to last visit here, no hx/o hepatitis exposure.  No specific bodily fluid exposures on the job.    Still drinks on average 1 drink per day, some weekends more.  He notes hx/o Hep B and Hep A vaccination.  Had uncle who died of hepatitis.     No other aggravating or relieving factors.    No other c/o.  The following portions of the patient's history were reviewed and updated as appropriate: allergies, current medications, past family history, past medical history, past social history, past surgical history and problem list.   ROS Otherwise as in subjective above  Past Medical History:  Diagnosis Date  . ADD (attention deficit disorder)   . Childhood asthma   . Dysgraphia    childhood  . Dyslexia    childhood   Current Outpatient Medications on File Prior to Visit  Medication Sig Dispense Refill  . amphetamine-dextroamphetamine (ADDERALL XR) 30 MG 24 hr capsule Take 2 capsules (60 mg total) by mouth daily. 60 capsule 0   No current facility-administered medications on file prior to visit.    Family History  Problem Relation Age of Onset  . Hypertension Mother   . Hypertension Father   . COPD Father   . Heart disease Father        CHF  . Diabetes Maternal Grandfather   . Cancer Maternal Grandfather        lung, colon  . Cancer Maternal Grandmother        breast  . Stroke Maternal Grandmother   . Heart disease Paternal Grandmother        CHF  . Cancer Paternal Grandfather        stomach       Objective BP 140/90   Pulse 100   Temp 97.7 F (36.5 C) (Oral)   Resp 16   Ht 5\' 11"  (1.803 m)   Wt 210 lb 12.8 oz (95.6 kg)   SpO2 98%   BMI 29.40 kg/m   BP Readings from Last 3 Encounters:  04/25/18 140/90  10/29/17 132/72  07/03/17 124/90   Wt Readings from Last 3 Encounters:  04/25/18 210 lb 12.8 oz (95.6 kg)  10/29/17 219 lb 9.6 oz (99.6 kg)  07/03/17 214 lb 12.8 oz (97.4 kg)    General appearance: alert, no distress, WD/WN Psych: pleasant, good eye contact, answers questions appropriately Heart RRR, normal s1, s2, no murmurs Lungs clear Ext: no edema Pulses 2+ UE and LE Abdomen: +bs, soft, nontender, no mass, no organomegaly    Assessment: Encounter Diagnoses  Name Primary?  . Attention deficit disorder, unspecified hyperactivity presence Yes  . Elevated blood-pressure reading without diagnosis of hypertension   . Elevated LFTs   . High risk medication use      Plan: ADD - counseled on risks/benefits of medication, c/t current medication  Elevated BP - advised he montior BPs and pulse at home and get  me readings in 3 weeks.  Counseled on DASH diet, limiting sodium, sleep hygiene.    Elevated LFTs - repeat lab today, counseled on limiting alcohol   Slade was seen today for med check.  Diagnoses and all orders for this visit:  Attention deficit disorder, unspecified hyperactivity presence  Elevated blood-pressure reading without diagnosis of hypertension  Elevated LFTs -     Hepatic function panel  High risk medication use  Other orders -     amphetamine-dextroamphetamine (ADDERALL XR) 30 MG 24 hr capsule; Take 2 capsules (60 mg total) by mouth daily.

## 2018-04-26 LAB — HEPATIC FUNCTION PANEL
ALBUMIN: 4.8 g/dL (ref 3.5–5.5)
ALT: 102 IU/L — ABNORMAL HIGH (ref 0–44)
AST: 44 IU/L — ABNORMAL HIGH (ref 0–40)
Alkaline Phosphatase: 69 IU/L (ref 39–117)
BILIRUBIN TOTAL: 0.5 mg/dL (ref 0.0–1.2)
BILIRUBIN, DIRECT: 0.15 mg/dL (ref 0.00–0.40)
TOTAL PROTEIN: 7.1 g/dL (ref 6.0–8.5)

## 2018-04-28 ENCOUNTER — Other Ambulatory Visit: Payer: Self-pay | Admitting: Medical

## 2018-04-28 DIAGNOSIS — R7989 Other specified abnormal findings of blood chemistry: Secondary | ICD-10-CM

## 2018-04-28 DIAGNOSIS — Z79899 Other long term (current) drug therapy: Secondary | ICD-10-CM

## 2018-04-28 DIAGNOSIS — R945 Abnormal results of liver function studies: Principal | ICD-10-CM

## 2018-04-28 NOTE — Progress Notes (Signed)
See lab

## 2018-04-29 ENCOUNTER — Other Ambulatory Visit: Payer: BLUE CROSS/BLUE SHIELD

## 2018-05-15 ENCOUNTER — Other Ambulatory Visit: Payer: Self-pay

## 2018-05-20 LAB — FERRITIN: FERRITIN: 588 ng/mL — AB (ref 30–400)

## 2018-05-20 LAB — HEPATITIS C ANTIBODY: HEP C VIRUS AB: 0.1 {s_co_ratio} (ref 0.0–0.9)

## 2018-05-20 LAB — IRON: Iron: 55 ug/dL (ref 38–169)

## 2018-05-20 LAB — SPECIMEN STATUS REPORT

## 2018-05-20 LAB — HEPATITIS B SURFACE ANTIGEN: Hepatitis B Surface Ag: NEGATIVE

## 2018-05-29 ENCOUNTER — Telehealth: Payer: Self-pay

## 2018-05-29 ENCOUNTER — Other Ambulatory Visit: Payer: Self-pay | Admitting: Medical

## 2018-05-29 ENCOUNTER — Ambulatory Visit
Admission: RE | Admit: 2018-05-29 | Discharge: 2018-05-29 | Disposition: A | Discharge status: Home / Self Care | Payer: BLUE CROSS/BLUE SHIELD | Source: Ambulatory Visit | Attending: Medical | Admitting: Medical

## 2018-05-29 DIAGNOSIS — Z79899 Other long term (current) drug therapy: Secondary | ICD-10-CM

## 2018-05-29 DIAGNOSIS — R7989 Other specified abnormal findings of blood chemistry: Secondary | ICD-10-CM

## 2018-05-29 DIAGNOSIS — K760 Fatty (change of) liver, not elsewhere classified: Secondary | ICD-10-CM | POA: Diagnosis not present

## 2018-05-29 DIAGNOSIS — R945 Abnormal results of liver function studies: Principal | ICD-10-CM

## 2018-05-29 MED ORDER — AMPHETAMINE-DEXTROAMPHET ER 30 MG PO CP24: 60.0000 mg | ORAL_CAPSULE | Freq: Every day | ORAL | 0 refills | Status: DC

## 2018-05-29 NOTE — Telephone Encounter (Signed)
Pt has requested a refill on his Adderall.

## 2018-06-30 ENCOUNTER — Telehealth: Payer: Self-pay | Admitting: Family Medicine

## 2018-06-30 ENCOUNTER — Other Ambulatory Visit: Payer: Self-pay | Admitting: Medical

## 2018-06-30 MED ORDER — AMPHETAMINE-DEXTROAMPHET ER 30 MG PO CP24: 60.0000 mg | ORAL_CAPSULE | Freq: Every day | ORAL | 0 refills | Status: DC

## 2018-06-30 NOTE — Telephone Encounter (Signed)
Pt called and needs adderall refill to [facility] on [address]

## 2018-07-31 ENCOUNTER — Telehealth: Payer: Self-pay | Admitting: Medical

## 2018-07-31 ENCOUNTER — Other Ambulatory Visit: Payer: Self-pay | Admitting: Medical

## 2018-07-31 MED ORDER — AMPHETAMINE-DEXTROAMPHET ER 30 MG PO CP24: 60.0000 mg | ORAL_CAPSULE | Freq: Every day | ORAL | 0 refills | Status: DC

## 2018-07-31 NOTE — Telephone Encounter (Signed)
Med sent.

## 2018-07-31 NOTE — Telephone Encounter (Signed)
Pt called for refills of Adderall XR. Pt takes 2 in the morning. [facility] [address]. Pt can be reached at [phone].

## 2018-09-01 ENCOUNTER — Telehealth: Payer: Self-pay | Admitting: Family Medicine

## 2018-09-01 ENCOUNTER — Other Ambulatory Visit: Payer: Self-pay | Admitting: Medical

## 2018-09-01 MED ORDER — AMPHETAMINE-DEXTROAMPHET ER 30 MG PO CP24
60.0000 mg | ORAL_CAPSULE | Freq: Every day | ORAL | 0 refills | Status: DC
Start: 2018-09-01 — End: 2018-10-01

## 2018-09-01 NOTE — Telephone Encounter (Signed)
[facility] [address]

## 2018-09-25 DIAGNOSIS — M62830 Muscle spasm of back: Secondary | ICD-10-CM | POA: Diagnosis not present

## 2018-09-25 DIAGNOSIS — M609 Myositis, unspecified: Secondary | ICD-10-CM | POA: Diagnosis not present

## 2018-09-25 DIAGNOSIS — M9904 Segmental and somatic dysfunction of sacral region: Secondary | ICD-10-CM | POA: Diagnosis not present

## 2018-09-25 DIAGNOSIS — M9902 Segmental and somatic dysfunction of thoracic region: Secondary | ICD-10-CM | POA: Diagnosis not present

## 2018-10-01 ENCOUNTER — Other Ambulatory Visit: Payer: Self-pay | Admitting: Medical

## 2018-10-01 ENCOUNTER — Telehealth: Payer: Self-pay | Admitting: Medical

## 2018-10-01 MED ORDER — AMPHETAMINE-DEXTROAMPHET ER 30 MG PO CP24: 60.0000 mg | ORAL_CAPSULE | Freq: Every day | ORAL | 0 refills | Status: DC

## 2018-10-01 NOTE — Telephone Encounter (Signed)
Pt called and is requesting a refill on his adderall xr pt would like it sent to the [id number] - [place], [place] - [address]

## 2018-10-01 NOTE — Telephone Encounter (Signed)
Left message that RX was ready

## 2018-10-01 NOTE — Telephone Encounter (Signed)
Med sent.

## 2018-10-22 DIAGNOSIS — B349 Viral infection, unspecified: Secondary | ICD-10-CM | POA: Diagnosis not present

## 2018-10-31 ENCOUNTER — Telehealth: Payer: Self-pay | Admitting: Medical

## 2018-10-31 ENCOUNTER — Other Ambulatory Visit: Payer: Self-pay | Admitting: Medical

## 2018-10-31 MED ORDER — AMPHETAMINE-DEXTROAMPHET ER 30 MG PO CP24: 60.0000 mg | ORAL_CAPSULE | Freq: Every day | ORAL | 0 refills | Status: DC

## 2018-10-31 NOTE — Telephone Encounter (Signed)
Pt  needs refill on his adderall 30 mg

## 2018-10-31 NOTE — Telephone Encounter (Signed)
Med sent, set up for 75mo med check or physical if time for CPX

## 2018-10-31 NOTE — Telephone Encounter (Signed)
Called and left pt a voicemail.

## 2018-11-28 ENCOUNTER — Telehealth: Payer: Self-pay | Admitting: Medical

## 2018-11-28 ENCOUNTER — Other Ambulatory Visit: Payer: Self-pay | Admitting: Medical

## 2018-11-28 MED ORDER — AMPHETAMINE-DEXTROAMPHET ER 30 MG PO CP24: 60.0000 mg | ORAL_CAPSULE | Freq: Every day | ORAL | 0 refills | Status: DC

## 2018-11-28 NOTE — Telephone Encounter (Signed)
Called patient, he stated he was not near his calendar and that he will call back to schedule his physical.

## 2018-11-28 NOTE — Telephone Encounter (Signed)
Pt needs refill on Adderall, he uses the [facility] on [address]

## 2018-11-28 NOTE — Telephone Encounter (Signed)
Med sent.   Get in for physical if last CPX > 1 year.  If not, get in for 105mo med check

## 2018-12-15 DIAGNOSIS — M62830 Muscle spasm of back: Secondary | ICD-10-CM | POA: Diagnosis not present

## 2018-12-15 DIAGNOSIS — M609 Myositis, unspecified: Secondary | ICD-10-CM | POA: Diagnosis not present

## 2018-12-15 DIAGNOSIS — M9904 Segmental and somatic dysfunction of sacral region: Secondary | ICD-10-CM | POA: Diagnosis not present

## 2018-12-15 DIAGNOSIS — R937 Abnormal findings on diagnostic imaging of other parts of musculoskeletal system: Secondary | ICD-10-CM | POA: Diagnosis not present

## 2018-12-15 DIAGNOSIS — M5134 Other intervertebral disc degeneration, thoracic region: Secondary | ICD-10-CM | POA: Diagnosis not present

## 2018-12-15 DIAGNOSIS — M9902 Segmental and somatic dysfunction of thoracic region: Secondary | ICD-10-CM | POA: Diagnosis not present

## 2018-12-15 DIAGNOSIS — Z9181 History of falling: Secondary | ICD-10-CM | POA: Diagnosis not present

## 2018-12-15 DIAGNOSIS — M438X6 Other specified deforming dorsopathies, lumbar region: Secondary | ICD-10-CM | POA: Diagnosis not present

## 2018-12-15 DIAGNOSIS — M438X4 Other specified deforming dorsopathies, thoracic region: Secondary | ICD-10-CM | POA: Diagnosis not present

## 2018-12-16 DIAGNOSIS — M62830 Muscle spasm of back: Secondary | ICD-10-CM | POA: Diagnosis not present

## 2018-12-16 DIAGNOSIS — M609 Myositis, unspecified: Secondary | ICD-10-CM | POA: Diagnosis not present

## 2018-12-16 DIAGNOSIS — M9904 Segmental and somatic dysfunction of sacral region: Secondary | ICD-10-CM | POA: Diagnosis not present

## 2018-12-16 DIAGNOSIS — M9902 Segmental and somatic dysfunction of thoracic region: Secondary | ICD-10-CM | POA: Diagnosis not present

## 2018-12-18 DIAGNOSIS — M62830 Muscle spasm of back: Secondary | ICD-10-CM | POA: Diagnosis not present

## 2018-12-18 DIAGNOSIS — M9904 Segmental and somatic dysfunction of sacral region: Secondary | ICD-10-CM | POA: Diagnosis not present

## 2018-12-18 DIAGNOSIS — M9902 Segmental and somatic dysfunction of thoracic region: Secondary | ICD-10-CM | POA: Diagnosis not present

## 2018-12-18 DIAGNOSIS — M609 Myositis, unspecified: Secondary | ICD-10-CM | POA: Diagnosis not present

## 2018-12-19 DIAGNOSIS — S32028A Other fracture of second lumbar vertebra, initial encounter for closed fracture: Secondary | ICD-10-CM | POA: Diagnosis not present

## 2018-12-19 DIAGNOSIS — S32018A Other fracture of first lumbar vertebra, initial encounter for closed fracture: Secondary | ICD-10-CM | POA: Diagnosis not present

## 2018-12-19 DIAGNOSIS — S32038A Other fracture of third lumbar vertebra, initial encounter for closed fracture: Secondary | ICD-10-CM | POA: Diagnosis not present

## 2018-12-19 DIAGNOSIS — S32048A Other fracture of fourth lumbar vertebra, initial encounter for closed fracture: Secondary | ICD-10-CM | POA: Diagnosis not present

## 2018-12-30 DIAGNOSIS — M9904 Segmental and somatic dysfunction of sacral region: Secondary | ICD-10-CM | POA: Diagnosis not present

## 2018-12-30 DIAGNOSIS — M609 Myositis, unspecified: Secondary | ICD-10-CM | POA: Diagnosis not present

## 2018-12-30 DIAGNOSIS — M9902 Segmental and somatic dysfunction of thoracic region: Secondary | ICD-10-CM | POA: Diagnosis not present

## 2018-12-30 DIAGNOSIS — M62830 Muscle spasm of back: Secondary | ICD-10-CM | POA: Diagnosis not present

## 2018-12-31 ENCOUNTER — Ambulatory Visit: Payer: BLUE CROSS/BLUE SHIELD | Admitting: Medical

## 2018-12-31 ENCOUNTER — Encounter: Payer: Self-pay | Admitting: Medical

## 2018-12-31 ENCOUNTER — Other Ambulatory Visit: Payer: Self-pay

## 2018-12-31 VITALS — Ht 70.0 in | Wt 210.0 lb

## 2018-12-31 DIAGNOSIS — R7989 Other specified abnormal findings of blood chemistry: Secondary | ICD-10-CM

## 2018-12-31 DIAGNOSIS — R945 Abnormal results of liver function studies: Secondary | ICD-10-CM | POA: Diagnosis not present

## 2018-12-31 DIAGNOSIS — R030 Elevated blood-pressure reading, without diagnosis of hypertension: Secondary | ICD-10-CM | POA: Diagnosis not present

## 2018-12-31 DIAGNOSIS — Z79899 Other long term (current) drug therapy: Secondary | ICD-10-CM | POA: Diagnosis not present

## 2018-12-31 DIAGNOSIS — F988 Other specified behavioral and emotional disorders with onset usually occurring in childhood and adolescence: Secondary | ICD-10-CM

## 2018-12-31 MED ORDER — AMPHETAMINE-DEXTROAMPHET ER 30 MG PO CP24: 60.0000 mg | ORAL_CAPSULE | Freq: Every day | ORAL | 0 refills | Status: DC

## 2018-12-31 NOTE — Progress Notes (Signed)
Subjective:     Patient ID: Dellie Catholicerek K Loveless, male   DOB: 06/04/1991, 28 y.o.   MRN: [id number]  This visit type was conducted due to national recommendations for restrictions regarding the COVID-19 Pandemic (e.g. social distancing) in an effort to limit this patient's exposure and mitigate transmission in our community.  This format is felt to be most appropriate for this patient at this time.    Documentation for virtual audio and video telecommunications through Zoom encounter:  The patient was located at home. The provider was located in the office. The patient did consent to this visit and is aware of possible charges through their insurance for this visit.  The other persons participating in this telemedicine service were none. Time spent on call was 15 minutes and in review of previous records >15 minutes total.  This virtual service is not related to other E/M service within previous 7 days.   HPI Chief Complaint  Patient presents with  . virtual med check    med refill   Virtual visit today for recheck on medication as well as concerns with liver and blood pressure.  ADD-doing fine on current regimen, has been on the same medicine for years.  Work is going fine, he is staying focused, no side effects, no problems with sleep or appetite.  "eating a like a horse , sleep like a baby," no side effects.    Elevated blood pressure on some of his visits in the past- he reports that he has been good with exercise and eating healthy, limiting alcohol.  With the coronavirus stay at home measures, he is not been doing social drinking which is typically when he drinks.  Recently he went to his chiropractor and his blood pressure was 130/90.  He does check his pulse at home periodically and typically he sees resting pulse 80-90 beats per minute.  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 ADD (attention deficit disorder)   . Childhood asthma   . Dysgraphia    childhood  . Dyslexia    childhood    Review of Systems As in subjective    Objective:   Physical Exam  Ht 5\' 10"  (1.778 m)   Wt 210 lb (95.3 kg)   BMI 30.13 kg/m   Wt Readings from Last 3 Encounters:  12/31/18 210 lb (95.3 kg)  04/25/18 210 lb 12.8 oz (95.6 kg)  10/29/17 219 lb 9.6 oz (99.6 kg)   Due to coronavirus pandemic stay at home measures, patient visit was virtual and they were not examined in person.   General: Well-developed well-nourished no acute distress     Assessment:     Encounter Diagnoses  Name Primary?  . Attention deficit disorder, unspecified hyperactivity presence Yes  . Elevated LFTs   . High risk medication use   . Elevated blood-pressure reading without diagnosis of hypertension        Plan:     ADD-doing fine on current regimen, continue current medications  Elevated LFT-we discussed labs from the last year as well as the ultrasound done in the fall 2019.  The findings at that time showed some hepatic steatosis but he was also drinking pretty regular with alcohol which was likely the cause of the elevated liver test.  Since that visit he notes  that he is eating healthier, only drinks socially and feels like he is doing much better job with limiting alcohol and healthy diet.  Exercising as well.  We will plan to recheck liver test at his physical in August 2020  Elevated blood pressures-recent blood pressures and pulses at home have been normal to borderline.  He attributes the improvement in his lifestyle changes with diet and exercise.  I asked him to monitor his pulse and blood pressures at home, discussed normal pressure and pulse versus borderline and stage I hypertension.  Follow-up with physical in August 2020  I reviewed the narcs report no concerning findings given that he is on a  controlled substance  Stace was seen today for virtual med check.  Diagnoses and all orders for this visit:  Attention deficit disorder, unspecified hyperactivity presence  Elevated LFTs  High risk medication use  Elevated blood-pressure reading without diagnosis of hypertension  Other orders -     amphetamine-dextroamphetamine (ADDERALL XR) 30 MG 24 hr capsule; Take 2 capsules (60 mg total) by mouth daily. -     amphetamine-dextroamphetamine (ADDERALL XR) 30 MG 24 hr capsule; Take 2 capsules (60 mg total) by mouth daily. -     amphetamine-dextroamphetamine (ADDERALL XR) 30 MG 24 hr capsule; Take 2 capsules (60 mg total) by mouth daily.

## 2019-01-07 DIAGNOSIS — M9904 Segmental and somatic dysfunction of sacral region: Secondary | ICD-10-CM | POA: Diagnosis not present

## 2019-01-07 DIAGNOSIS — M9902 Segmental and somatic dysfunction of thoracic region: Secondary | ICD-10-CM | POA: Diagnosis not present

## 2019-01-07 DIAGNOSIS — M609 Myositis, unspecified: Secondary | ICD-10-CM | POA: Diagnosis not present

## 2019-01-07 DIAGNOSIS — M62830 Muscle spasm of back: Secondary | ICD-10-CM | POA: Diagnosis not present

## 2019-01-14 DIAGNOSIS — M609 Myositis, unspecified: Secondary | ICD-10-CM | POA: Diagnosis not present

## 2019-01-14 DIAGNOSIS — M62830 Muscle spasm of back: Secondary | ICD-10-CM | POA: Diagnosis not present

## 2019-01-14 DIAGNOSIS — M9904 Segmental and somatic dysfunction of sacral region: Secondary | ICD-10-CM | POA: Diagnosis not present

## 2019-01-14 DIAGNOSIS — M9902 Segmental and somatic dysfunction of thoracic region: Secondary | ICD-10-CM | POA: Diagnosis not present

## 2019-01-29 ENCOUNTER — Telehealth: Payer: Self-pay | Admitting: *Deleted

## 2019-01-29 ENCOUNTER — Other Ambulatory Visit: Payer: Self-pay | Admitting: Medical

## 2019-01-29 MED ORDER — AMPHETAMINE-DEXTROAMPHET ER 30 MG PO CP24: 60.0000 mg | ORAL_CAPSULE | Freq: Every day | ORAL | 0 refills | Status: DC

## 2019-01-29 NOTE — Telephone Encounter (Signed)
Patient called and needs a refill of adderall to [facility].Thanks.

## 2019-03-03 ENCOUNTER — Other Ambulatory Visit: Payer: Self-pay | Admitting: Medical

## 2019-03-03 ENCOUNTER — Telehealth: Payer: Self-pay | Admitting: Medical

## 2019-03-03 MED ORDER — AMPHETAMINE-DEXTROAMPHET ER 30 MG PO CP24: 60.0000 mg | ORAL_CAPSULE | Freq: Every day | ORAL | 0 refills | Status: DC

## 2019-03-03 NOTE — Telephone Encounter (Signed)
Pt called and is requesting a refill on his adderall xr pt would like it sent to the [facility], [address] AT [place]

## 2019-03-22 IMAGING — US US ABDOMEN LIMITED
1 series · 14 of 25 positions shown · non-contrast
Comparison: None.

CLINICAL DATA: Elevated LFTs.  No abdominal pain.

EXAM:
ULTRASOUND ABDOMEN LIMITED RIGHT UPPER QUADRANT

[Series 1: us abdomen limited · 0.22mm/px · 14 of 42 slices shown]
[im 1/42]
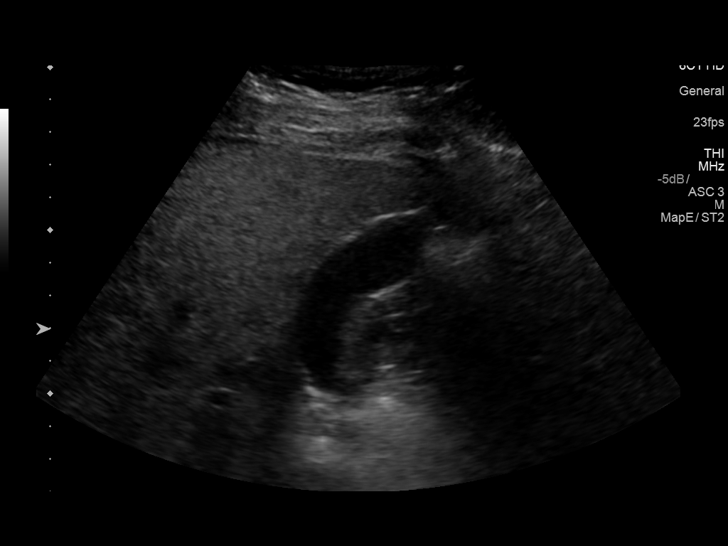
[im 4/42]
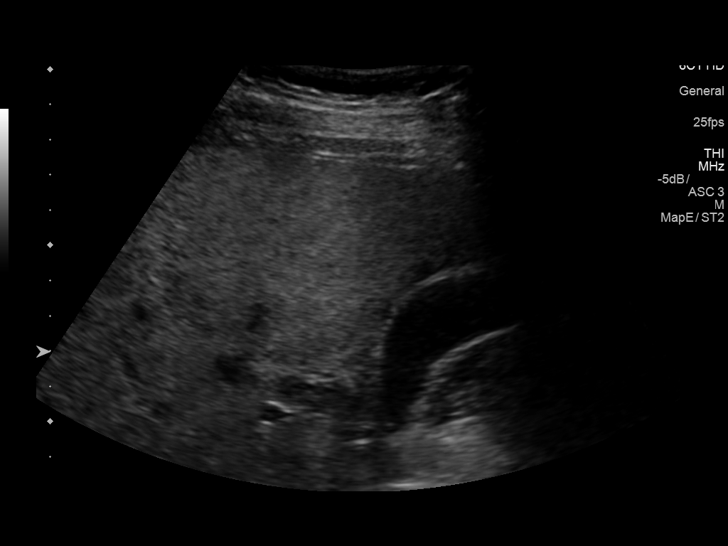
[im 7/42]
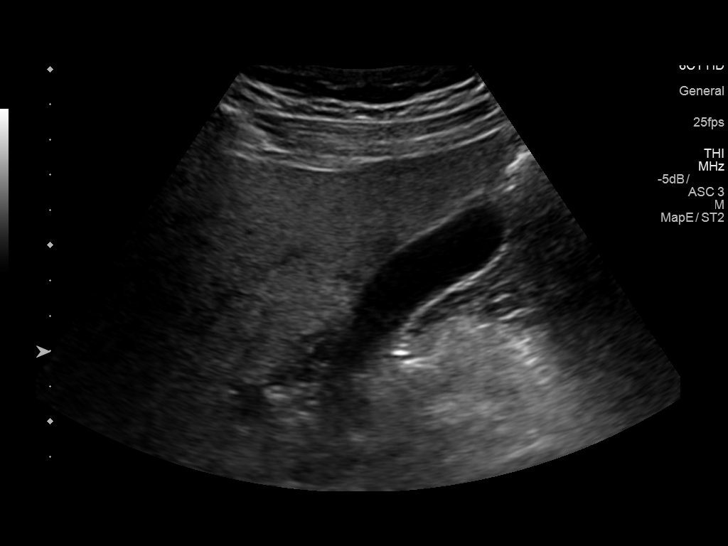
[im 11/42]
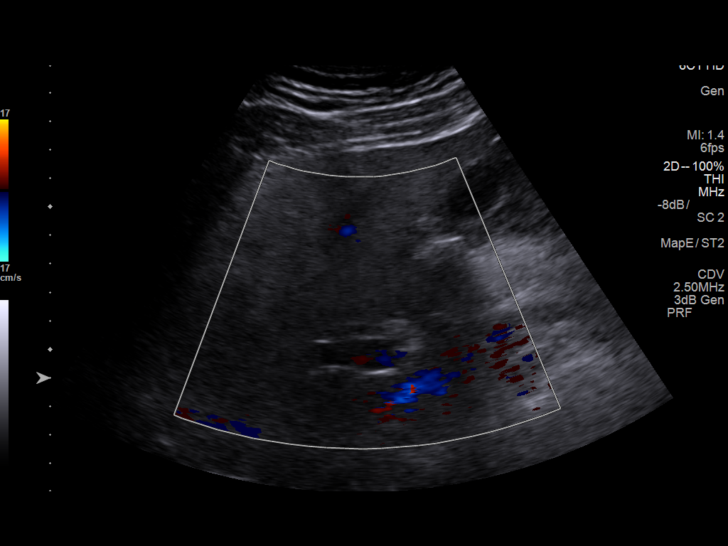
[im 14/42]
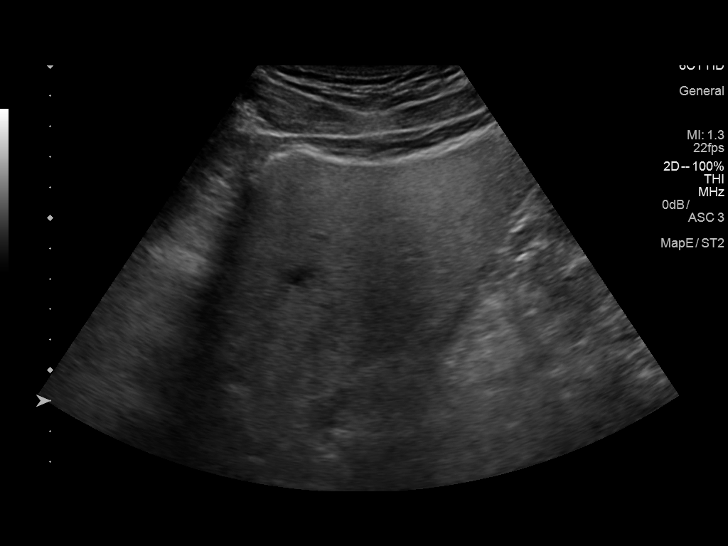
[im 16/42]
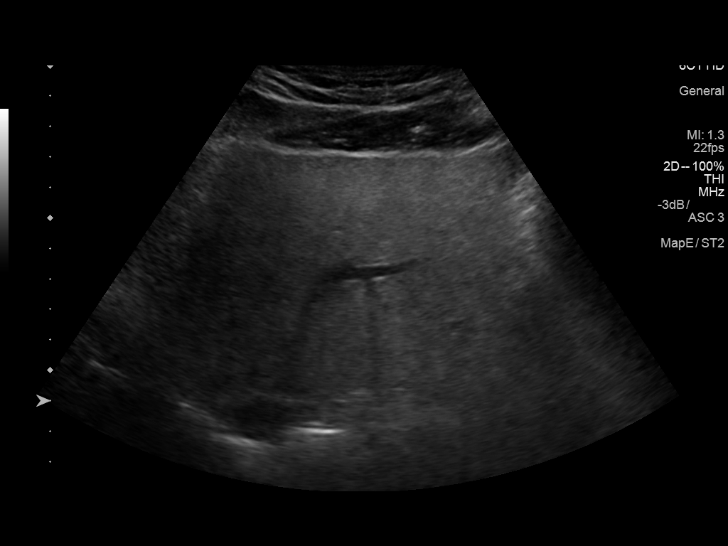
[im 19/42]
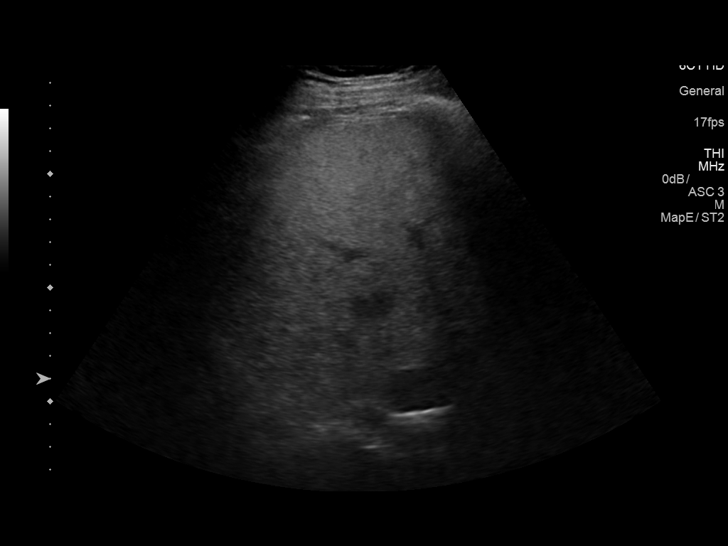
[im 23/42]
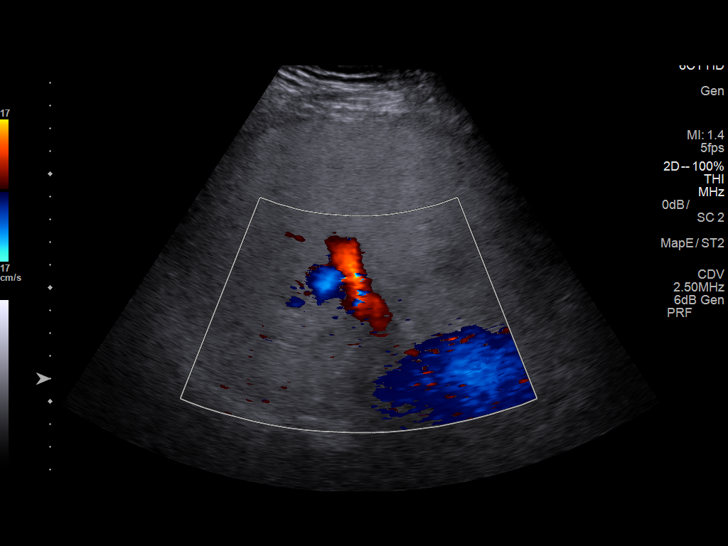
[im 26/42]
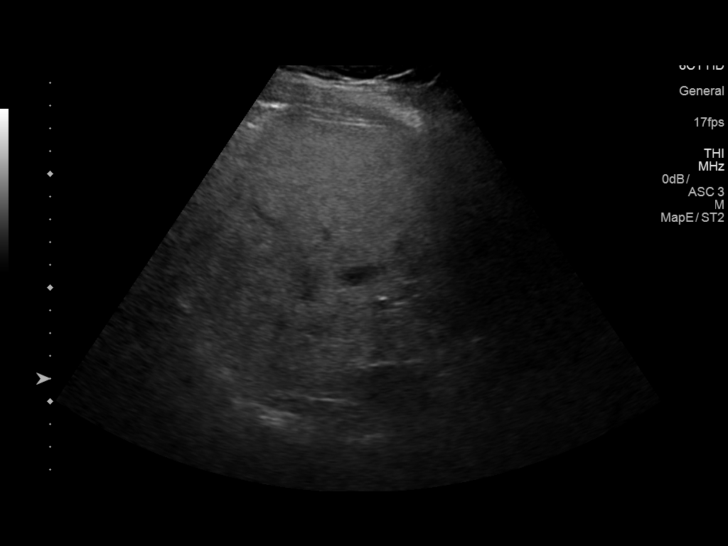
[im 28/42]
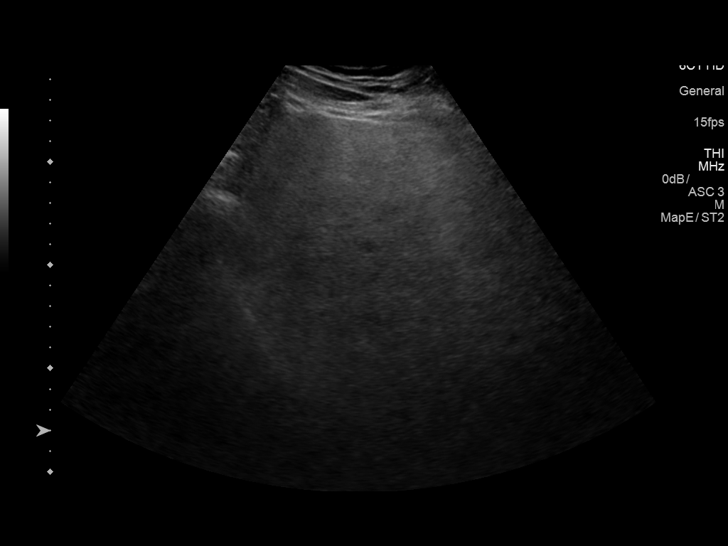
[im 31/42]
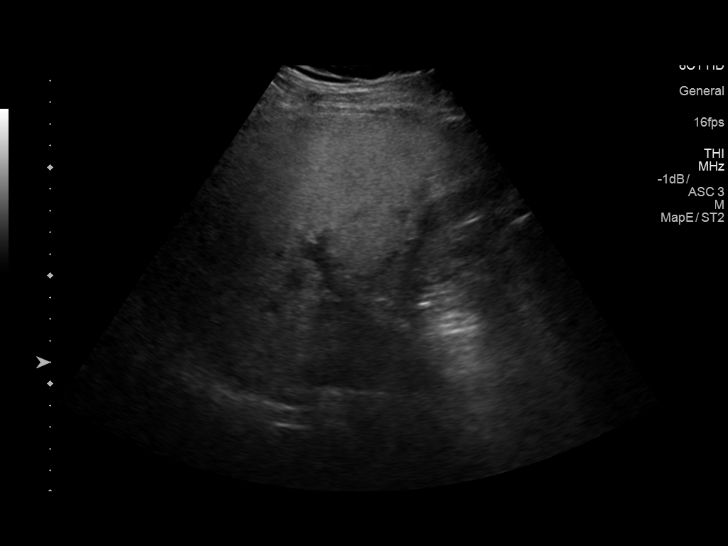
[im 35/42]
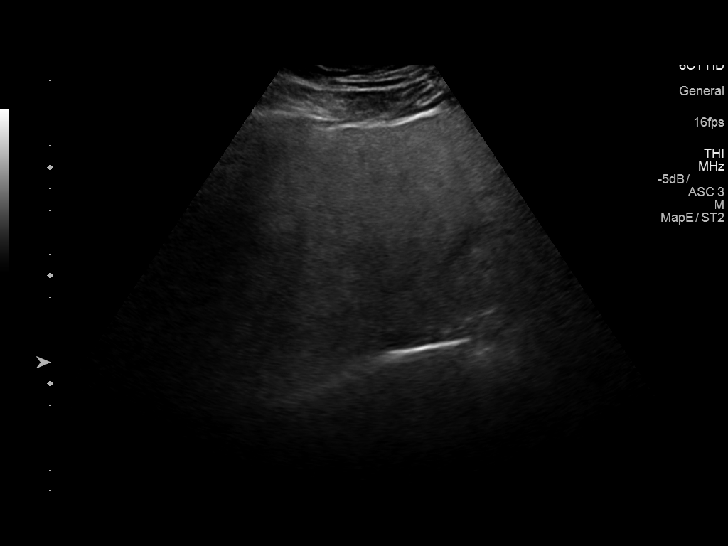
[im 38/42]
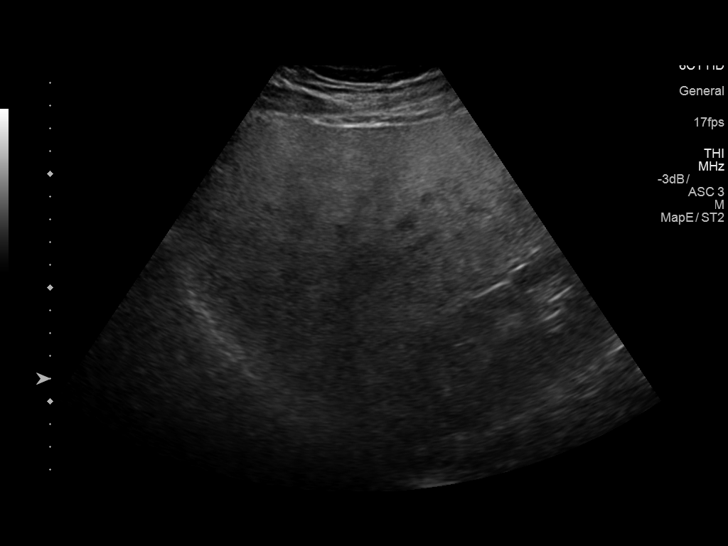
[im 42/42]
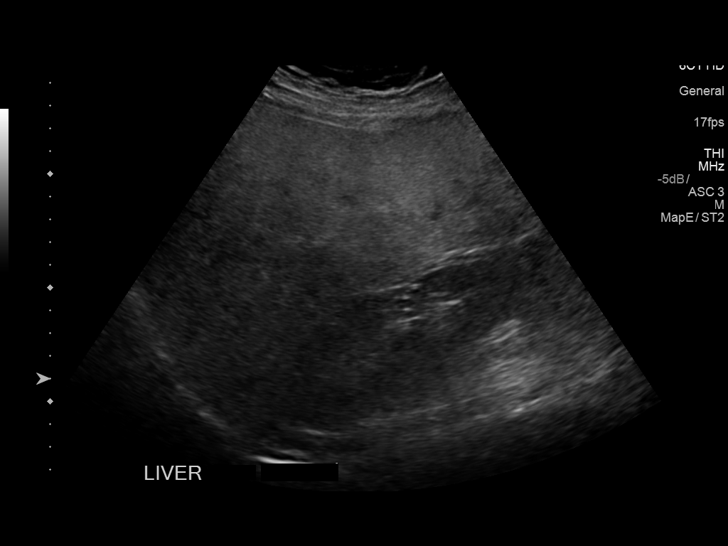

[14 of 25 positions shown; findings below may reference images not displayed]

FINDINGS: Gallbladder:

Gallbladder has a normal appearance. Gallbladder wall is
millimeter, within normal limits. No stones or pericholecystic
fluid. No sonographic Murphy's sign.

Common bile duct:

Diameter: 3.5 millimeters

Liver:

Coarsened, hyperechoic echotexture. There is suspected focal fatty
sparing near the gallbladder fossa. No suspicious liver lesions.

Portal vein is patent on color Doppler imaging with normal direction
of blood flow towards the liver.
IMPRESSION: 1. Hepatic steatosis.
2. No evidence for acute cholecystitis.

## 2019-04-02 ENCOUNTER — Telehealth: Payer: Self-pay | Admitting: Internal Medicine

## 2019-04-02 ENCOUNTER — Other Ambulatory Visit: Payer: Self-pay | Admitting: Medical

## 2019-04-02 MED ORDER — AMPHETAMINE-DEXTROAMPHET ER 30 MG PO CP24: 60.0000 mg | ORAL_CAPSULE | Freq: Every day | ORAL | 0 refills | Status: DC

## 2019-04-02 NOTE — Telephone Encounter (Signed)
Pt is requesting a refill on his adderall 30mg  to [facility] [address]

## 2019-04-03 ENCOUNTER — Telehealth: Payer: Self-pay | Admitting: Medical

## 2019-04-03 NOTE — Telephone Encounter (Signed)
If he just wants a test, direct him to 1 of the [facility] or community testing sites, or he can go to a local [facility] or urgent care for example  In general if he has had close contact with someone who had symptoms and tested positive then he technically should self quarantine at home away from others for 14 days per the [facility] guidelines.  The reason for this visit can take 14 days to watch for signs or symptoms.   The other approach is toe get tested for Covid himself.   If he is having symptoms then he should quarantine for the same time frame.  Does he have a specific question.   If he has a lot of questions, it may be worth setting him up a virtual consult  CDC web site has some good Q&A about Covid as well.

## 2019-04-03 NOTE — Telephone Encounter (Signed)
  Pt called and a co worker tested positive for Covid He has had contact with this person and he has questions And wants to know about testing etc  Please call

## 2019-04-03 NOTE — Telephone Encounter (Signed)
Spoke to patient. done

## 2019-04-30 ENCOUNTER — Telehealth: Payer: Self-pay | Admitting: Medical

## 2019-04-30 NOTE — Telephone Encounter (Signed)
He is not due for refill until the 16th. Audelia Acton will be back. Please forward to [place]. Thanks.

## 2019-04-30 NOTE — Telephone Encounter (Signed)
Is this ok to refill?  

## 2019-04-30 NOTE — Telephone Encounter (Signed)
Pt called and needs refill on Adderall he uses the [facility] on [address]

## 2019-05-03 ENCOUNTER — Other Ambulatory Visit: Payer: Self-pay | Admitting: Medical

## 2019-05-03 MED ORDER — AMPHETAMINE-DEXTROAMPHET ER 30 MG PO CP24: 60.0000 mg | ORAL_CAPSULE | Freq: Every day | ORAL | 0 refills | Status: DC

## 2019-06-01 ENCOUNTER — Telehealth: Payer: Self-pay | Admitting: Family Medicine

## 2019-06-01 NOTE — Telephone Encounter (Signed)
PT called for Adderall refill and prior auth.  [facility] on [address]

## 2019-06-02 ENCOUNTER — Other Ambulatory Visit: Payer: Self-pay | Admitting: Medical

## 2019-06-02 MED ORDER — AMPHETAMINE-DEXTROAMPHET ER 30 MG PO CP24: 60.0000 mg | ORAL_CAPSULE | Freq: Every day | ORAL | 0 refills | Status: DC

## 2019-06-03 NOTE — Telephone Encounter (Signed)
Pt called that he needs P.A. Adderall.  Pt has new insurnace ID with [employer]  P.A. was completed ID# [id number] BIN [id number] GRP [id number] T# [phone]  Pt informed

## 2019-06-29 ENCOUNTER — Other Ambulatory Visit: Payer: Self-pay | Admitting: Medical

## 2019-06-29 ENCOUNTER — Telehealth: Payer: Self-pay | Admitting: Family Medicine

## 2019-06-29 DIAGNOSIS — F4323 Adjustment disorder with mixed anxiety and depressed mood: Secondary | ICD-10-CM | POA: Diagnosis not present

## 2019-06-29 MED ORDER — AMPHETAMINE-DEXTROAMPHET ER 30 MG PO CP24: 60.0000 mg | ORAL_CAPSULE | Freq: Every day | ORAL | 0 refills | Status: DC

## 2019-06-29 NOTE — Telephone Encounter (Signed)
Pt req Adderall refill as he is going out of town on Thursday.  High [place]

## 2019-06-29 NOTE — Telephone Encounter (Signed)
Med sent.

## 2019-07-06 DIAGNOSIS — F4323 Adjustment disorder with mixed anxiety and depressed mood: Secondary | ICD-10-CM | POA: Diagnosis not present

## 2019-07-13 DIAGNOSIS — F4323 Adjustment disorder with mixed anxiety and depressed mood: Secondary | ICD-10-CM | POA: Diagnosis not present

## 2019-07-20 DIAGNOSIS — F4323 Adjustment disorder with mixed anxiety and depressed mood: Secondary | ICD-10-CM | POA: Diagnosis not present

## 2019-07-27 DIAGNOSIS — F4323 Adjustment disorder with mixed anxiety and depressed mood: Secondary | ICD-10-CM | POA: Diagnosis not present

## 2019-07-28 ENCOUNTER — Telehealth: Payer: Self-pay | Admitting: Medical

## 2019-07-28 NOTE — Telephone Encounter (Signed)
Pt called and needs refill on Adderrall sent to the [facility] on [address]

## 2019-07-29 ENCOUNTER — Other Ambulatory Visit: Payer: Self-pay | Admitting: Medical

## 2019-07-29 MED ORDER — AMPHETAMINE-DEXTROAMPHET ER 30 MG PO CP24: 60.0000 mg | ORAL_CAPSULE | Freq: Every day | ORAL | 0 refills | Status: DC

## 2019-07-29 NOTE — Telephone Encounter (Signed)
meds sent, set up for fasting physical before next refill

## 2019-07-29 NOTE — Telephone Encounter (Signed)
Got pt scheduled for Dec 3rd

## 2019-08-03 DIAGNOSIS — F4323 Adjustment disorder with mixed anxiety and depressed mood: Secondary | ICD-10-CM | POA: Diagnosis not present

## 2019-08-20 ENCOUNTER — Encounter: Payer: Self-pay | Admitting: Medical

## 2019-08-20 ENCOUNTER — Ambulatory Visit: Payer: BC Managed Care – PPO | Admitting: Medical

## 2019-08-20 ENCOUNTER — Other Ambulatory Visit: Payer: Self-pay

## 2019-08-20 VITALS — BP 132/80 | HR 98 | Temp 98.5°F | Ht 70.0 in | Wt 219.0 lb

## 2019-08-20 DIAGNOSIS — R7989 Other specified abnormal findings of blood chemistry: Secondary | ICD-10-CM | POA: Diagnosis not present

## 2019-08-20 DIAGNOSIS — Z23 Encounter for immunization: Secondary | ICD-10-CM | POA: Insufficient documentation

## 2019-08-20 DIAGNOSIS — F988 Other specified behavioral and emotional disorders with onset usually occurring in childhood and adolescence: Secondary | ICD-10-CM

## 2019-08-20 DIAGNOSIS — Z2821 Immunization not carried out because of patient refusal: Secondary | ICD-10-CM | POA: Insufficient documentation

## 2019-08-20 DIAGNOSIS — L659 Nonscarring hair loss, unspecified: Secondary | ICD-10-CM | POA: Insufficient documentation

## 2019-08-20 DIAGNOSIS — Z79899 Other long term (current) drug therapy: Secondary | ICD-10-CM | POA: Diagnosis not present

## 2019-08-20 DIAGNOSIS — K760 Fatty (change of) liver, not elsewhere classified: Secondary | ICD-10-CM | POA: Insufficient documentation

## 2019-08-20 DIAGNOSIS — Z0000 Encounter for general adult medical examination without abnormal findings: Secondary | ICD-10-CM | POA: Diagnosis not present

## 2019-08-20 DIAGNOSIS — Z7185 Encounter for immunization safety counseling: Secondary | ICD-10-CM | POA: Insufficient documentation

## 2019-08-20 DIAGNOSIS — Z7189 Other specified counseling: Secondary | ICD-10-CM

## 2019-08-20 NOTE — Progress Notes (Signed)
Subjective:   HPI  Anthony Pena is a 28 y.o. male who presents for a physical Chief Complaint  Patient presents with  . Annual Exam    with fasting labs    Medical care team includes: , Kermit BaloDavid S, PA-C here for primary care Dentist  Concerns: Doing great, but losing some hair, thinning at top of scalp.   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    [place]   Last Td about 10 years ago.    Drinks 1-3 dinks per day, gin in mixed drinks.  Diet currently, not the best, no particular discretion.    Exercise - lots of walking.  Not going to gym with covid.   Prior tachycardia - has apple watch that can run ecg, no reported abnormal rhythm on apple watch, and he notes no tachycardia   Reviewed their medical, surgical, family, social, medication, and allergy history and updated chart as appropriate.  Past Medical History:  Diagnosis Date  . ADD (attention deficit disorder)   . Childhood asthma   . Dysgraphia    childhood  . Dyslexia    childhood    Past Surgical History:  Procedure Laterality Date  . MOUTH SURGERY      Social History   Socioeconomic History  . Marital status: Single    Spouse name: Not on file  . Number of children: Not on file  . Years of education: Not on file  . Highest education level: Not on file  Occupational History  . Not on file  Social Needs  . Financial resource strain: Not on file  . Food insecurity    Worry: Not on file    Inability: Not on file  . Transportation needs    Medical: Not on file    Non-medical: Not on file  Tobacco Use  . Smoking status: Never Smoker  . Smokeless tobacco: Never Used  Substance and Sexual Activity  . Alcohol use: Yes    Alcohol/week: 2.0 standard drinks    Types: 2 Cans of beer per week  . Drug use: No  . Sexual activity: Not on file  Lifestyle  . Physical activity    Days per week: Not on file    Minutes per session: Not on file  . Stress: Not on file   Relationships  . Social Musicianconnections    Talks on phone: Not on file    Gets together: Not on file    Attends religious service: Not on file    Active member of club or organization: Not on file    Attends meetings of clubs or organizations: Not on file    Relationship status: Not on file  . Intimate partner violence    Fear of current or ex partner: Not on file    Emotionally abused: Not on file    Physically abused: Not on file    Forced sexual activity: Not on file  Other Topics Concern  . Not on file  Social History Narrative   Single, lives with some room mates.   Daughter, and she lives with her mother.   Exercise  - walking a lot.  08/2019    Family History  Problem Relation Age of Onset  . Hypertension Mother   . Hypertension Father   . COPD Father   . Heart disease Father        CHF  . Diabetes Maternal Grandfather   . Cancer Maternal Grandfather  lung, colon  . Cancer Maternal Grandmother        breast  . Stroke Maternal Grandmother   . Heart disease Paternal Grandmother        CHF  . Cancer Paternal Grandfather        stomach     Current Outpatient Medications:  .  amphetamine-dextroamphetamine (ADDERALL XR) 30 MG 24 hr capsule, Take 2 capsules (60 mg total) by mouth daily., Disp: 60 capsule, Rfl: 0 .  amphetamine-dextroamphetamine (ADDERALL XR) 30 MG 24 hr capsule, Take 2 capsules (60 mg total) by mouth daily. (Patient not taking: Reported on 08/20/2019), Disp: 60 capsule, Rfl: 0 .  amphetamine-dextroamphetamine (ADDERALL XR) 30 MG 24 hr capsule, Take 2 capsules (60 mg total) by mouth daily. (Patient not taking: Reported on 08/20/2019), Disp: 60 capsule, Rfl: 0  Allergies  Allergen Reactions  . Pollen Extract     Review of Systems Constitutional: -fever, -chills, -sweats, -unexpected weight change, -decreased appetite, -fatigue Allergy: -sneezing, -itching, -congestion Dermatology: -changing moles, --rash, -lumps ENT: -runny nose, -ear pain, -sore  throat, -hoarseness, -sinus pain, -teeth pain, - ringing in ears, -hearing loss, -nosebleeds Cardiology: -chest pain, -palpitations, -swelling, -difficulty breathing when lying flat, -waking up short of breath Respiratory: -cough, -shortness of breath, -difficulty breathing with exercise or exertion, -wheezing, -coughing up blood Gastroenterology: -abdominal pain, -nausea, -vomiting, -diarrhea, -constipation, -blood in stool, -changes in bowel movement, -difficulty swallowing or eating Hematology: -bleeding, -bruising  Musculoskeletal: -joint aches, -muscle aches, -joint swelling, -back pain, -neck pain, -cramping, -changes in gait Ophthalmology: denies vision changes, eye redness, itching, discharge Urology: -burning with urination, -difficulty urinating, -blood in urine, -urinary frequency, -urgency, -incontinence Neurology: -headache, -weakness, -tingling, -numbness, -memory loss, -falls, -dizziness Psychology: -depressed mood, -agitation, -sleep problems     Objective:   BP 132/80   Pulse 98   Temp 98.5 F (36.9 C)   Ht 5\' 10"  (1.778 m)   Wt 219 lb (99.3 kg)   SpO2 97%   BMI 31.42 kg/m   Wt Readings from Last 3 Encounters:  08/20/19 219 lb (99.3 kg)  12/31/18 210 lb (95.3 kg)  04/25/18 210 lb 12.8 oz (95.6 kg)   BP Readings from Last 3 Encounters:  08/20/19 132/80  04/25/18 140/90  10/29/17 132/72    General appearance: alert, no distress, WD/WN, Caucasian male Skin: unremarkable Neck: supple, no lymphadenopathy, no thyromegaly, no masses, normal ROM, no bruits Chest: non tender, normal shape and expansion Heart: RRR, normal S1, S2, no murmurs Lungs: CTA bilaterally, no wheezes, rhonchi, or rales Abdomen: +bs, soft, non tender, non distended, no masses, no hepatomegaly, no splenomegaly, no bruits Back: non tender, normal ROM, no scoliosis Musculoskeletal: upper extremities non tender, no obvious deformity, normal ROM throughout, lower extremities non tender, no obvious  deformity, normal ROM throughout Extremities: no edema, no cyanosis, no clubbing Pulses: 2+ symmetric, upper and lower extremities, normal cap refill Neurological: alert, oriented x 3, CN2-12 intact, strength normal upper extremities and lower extremities, sensation normal throughout, DTRs 2+ throughout, no cerebellar signs, gait normal Psychiatric: normal affect, behavior normal, pleasant  GU: normal male external genitalia,circumcised, nontender, no masses, no hernia, no lymphadenopathy Rectal: deferred   Assessment and Plan :    Encounter Diagnoses  Name Primary?  . Encounter for health maintenance examination in adult Yes  . Attention deficit disorder, unspecified hyperactivity presence   . High risk medication use   . Elevated LFTs   . Fatty liver   . Vaccine counseling   . Need for Tdap  vaccination   . Influenza vaccination declined   . Hair loss     Physical exam - discussed and counseled on healthy lifestyle, diet, exercise, preventative care, vaccinations, sick and well care, proper use of emergency dept and after hours care, and addressed their concerns.    Health screening: See your dentist yearly for routine dental care including hygiene visits twice yearly. See your eye doctor yearly for routine vision care.  Cancer screening Advised monthly self testicular exam  Vaccinations: Counseled on the following vaccines:  Influenza and Tdap Counseled on the Tdap (tetanus, diptheria, and acellular pertussis) vaccine.  Vaccine information sheet given. Tdap vaccine given after consent obtained.  We will request prior vaccine records from [facility] significant chronic issues discussed: Elevated LFT, fatty liver based on ultrasound 2019, negative Hep B and C testing.   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  Diagnoses and  all orders for this visit:  Encounter for health maintenance examination in adult -     Comprehensive metabolic panel -     CBC with Differential -     Lipid Panel -     TSH regular  Attention deficit disorder, unspecified hyperactivity presence  High risk medication use  Elevated LFTs  Fatty liver  Vaccine counseling  Need for Tdap vaccination  Influenza vaccination declined  Hair loss  Other orders -     Tdap vaccine greater than or equal to 7yo IM  Follow-up pending labs, yearly for physical, 32mo ADHD f/u.

## 2019-08-20 NOTE — Patient Instructions (Signed)
Thanks for trusting us with your health care and for coming in for a physical today.  Below are some general recommendations I have for you:  Yearly screenings See your eye doctor yearly for routine vision care. See your dentist yearly for routine dental care including hygiene visits twice yearly. See me here yearly for a routine physical and preventative care visit   Specific Concerns today:  . We updated your tetanus diptheria pertussis booster today.  This is good for 10 years . We will call with lab results   Please follow up yearly for a physical.   Preventative Care for Adults - Male      MAINTAIN REGULAR HEALTH EXAMS:  A routine yearly physical is a good way to check in with your primary care provider about your health and preventive screening. It is also an opportunity to share updates about your health and any concerns you have, and receive a thorough all-over exam.   Most health insurance companies pay for at least some preventative services.  Check with your health plan for specific coverages.  WHAT PREVENTATIVE SERVICES DO MEN NEED?  Adult men should have their weight and blood pressure checked regularly.   Men age 30 and older should have their cholesterol levels checked regularly.  Beginning at age 30 and continuing to age 30, men should be screened for colorectal cancer.  Certain people may need continued testing until age 30.  Updating vaccinations is part of preventative care.  Vaccinations help protect against diseases such as the flu.  Osteoporosis is a disease in which the bones lose minerals and strength as we age. Men ages 7265 and over should discuss this with their caregivers  Lab tests are generally done as part of preventative care to screen for anemia and blood disorders, to screen for problems with the kidneys and liver, to screen for bladder problems, to check blood sugar, and to check your cholesterol level.  Preventative services generally include  counseling about diet, exercise, avoiding tobacco, drugs, excessive alcohol consumption, and sexually transmitted infections.    GENERAL RECOMMENDATIONS FOR GOOD HEALTH:  Healthy diet:  Eat a variety of foods, including fruit, vegetables, animal or vegetable protein, such as meat, fish, chicken, and eggs, or beans, lentils, tofu, and grains, such as rice.  Drink plenty of water daily.  Decrease saturated fat in the diet, avoid lots of red meat, processed foods, sweets, fast foods, and fried foods.  Exercise:  Aerobic exercise helps maintain good heart health. At least 30-40 minutes of moderate-intensity exercise is recommended. For example, a brisk walk that increases your heart rate and breathing. This should be done on most days of the week.   Find a type of exercise or a variety of exercises that you enjoy so that it becomes a part of your daily life.  Examples are running, walking, swimming, water aerobics, and biking.  For motivation and support, explore group exercise such as aerobic class, spin class, Zumba, Yoga,or  martial arts, etc.    Set exercise goals for yourself, such as a certain weight goal, walk or run in a race such as a 5k walk/run.  Speak to your primary care provider about exercise goals.  Disease prevention:  If you smoke or chew tobacco, find out from your caregiver how to quit. It can literally save your life, no matter how long you have been a tobacco user. If you do not use tobacco, never begin.   Maintain a healthy diet and normal weight.  Increased weight leads to problems with blood pressure and diabetes.   The Body Mass Index or BMI is a way of measuring how much of your body is fat. Having a BMI above 27 increases the risk of heart disease, diabetes, hypertension, stroke and other problems related to obesity. Your caregiver can help determine your BMI and based on it develop an exercise and dietary program to help you achieve or maintain this important  measurement at a healthful level.  High blood pressure causes heart and blood vessel problems.  Persistent high blood pressure should be treated with medicine if weight loss and exercise do not work.   Fat and cholesterol leaves deposits in your arteries that can block them. This causes heart disease and vessel disease elsewhere in your body.  If your cholesterol is found to be high, or if you have heart disease or certain other medical conditions, then you may need to have your cholesterol monitored frequently and be treated with medication.   Ask if you should have a cardiac stress test if your history suggests this. A stress test is a test done on a treadmill that looks for heart disease. This test can find disease prior to there being a problem.  Osteoporosis is a disease in which the bones lose minerals and strength as we age. This can result in serious bone fractures. Risk of osteoporosis can be identified using a bone density scan. Men ages 48 and over should discuss this with their caregivers. Ask your caregiver whether you should be taking a calcium supplement and Vitamin D, to reduce the rate of osteoporosis.   Avoid drinking alcohol in excess (more than two drinks per day).  Avoid use of street drugs. Do not share needles with anyone. Ask for professional help if you need assistance or instructions on stopping the use of alcohol, cigarettes, and/or drugs.  Brush your teeth twice a day with fluoride toothpaste, and floss once a day. Good oral hygiene prevents tooth decay and gum disease. The problems can be painful, unattractive, and can cause other health problems. Visit your dentist for a routine oral and dental check up and preventive care every 6-12 months.   Look at your skin regularly.  Use a mirror to look at your back. Notify your caregivers of changes in moles, especially if there are changes in shapes, colors, a size larger than a pencil eraser, an irregular border, or development  of new moles.  Safety:  Use seatbelts 100% of the time, whether driving or as a passenger.  Use safety devices such as hearing protection if you work in environments with loud noise or significant background noise.  Use safety glasses when doing any work that could send debris in to the eyes.  Use a helmet if you ride a bike or motorcycle.  Use appropriate safety gear for contact sports.  Talk to your caregiver about gun safety.  Use sunscreen with a SPF (or skin protection factor) of 15 or greater.  Lighter skinned people are at a greater risk of skin cancer. Don't forget to also wear sunglasses in order to protect your eyes from too much damaging sunlight. Damaging sunlight can accelerate cataract formation.   Practice safe sex. Use condoms. Condoms are used for birth control and to help reduce the spread of sexually transmitted infections (or STIs).  Some of the STIs are gonorrhea (the clap), chlamydia, syphilis, trichomonas, herpes, HPV (human papilloma virus) and HIV (human immunodeficiency virus) which causes AIDS. The herpes, HIV  and HPV are viral illnesses that have no cure. These can result in disability, cancer and death.   Keep carbon monoxide and smoke detectors in your home functioning at all times. Change the batteries every 6 months or use a model that plugs into the wall.   Vaccinations:  Stay up to date with your tetanus shots and other required immunizations. You should have a booster for tetanus every 10 years. Be sure to get your flu shot every year, since 5%-20% of the [place] population comes down with the flu. The flu vaccine changes each year, so being vaccinated once is not enough. Get your shot in the fall, before the flu season peaks.   Other vaccines to consider:  Human Papilloma Virus or HPV causes cancer of the cervix, and other infections that can be transmitted from person to person. There is a vaccine for HPV, and males should get immunized between the ages of 16 and  29. It requires a series of 3 shots.   Pneumococcal vaccine to protect against certain types of pneumonia.  This is normally recommended for adults age 64 or older.  However, adults younger than 30 years old with certain underlying conditions such as diabetes, heart or lung disease should also receive the vaccine.  Shingles vaccine to protect against Varicella Zoster if you are older than age 66, or younger than 30 years old with certain underlying illness.  If you have not had the Shingrix vaccine, please call your insurer to inquire about coverage for the Shingrix vaccine given in 2 doses.   Some insurers cover this vaccine after age 78, some cover this after age 19.  If your insurer covers this, then call to schedule appointment to have this vaccine here  Hepatitis A vaccine to protect against a form of infection of the liver by a virus acquired from food.  Hepatitis B vaccine to protect against a form of infection of the liver by a virus acquired from blood or body fluids, particularly if you work in health care.  If you plan to travel internationally, check with your local health department for specific vaccination recommendations.   What should I know about cancer screening? Many types of cancers can be detected early and may often be prevented. Lung Cancer  You should be screened every year for lung cancer if: ? You are a current smoker who has smoked for at least 30 years. ? You are a former smoker who has quit within the past 15 years.  Talk to your health care provider about your screening options, when you should start screening, and how often you should be screened.  Colorectal Cancer  Routine colorectal cancer screening usually begins at 30 years of age and should be repeated every 5-10 years until you are 30 years old. You may need to be screened more often if early forms of precancerous polyps or small growths are found. Your health care provider may recommend screening at an  earlier age if you have risk factors for colon cancer.  Your health care provider may recommend using home test kits to check for hidden blood in the stool.  A small camera at the end of a tube can be used to examine your colon (sigmoidoscopy or colonoscopy). This checks for the earliest forms of colorectal cancer.  Prostate and Testicular Cancer  Depending on your age and overall health, your health care provider may do certain tests to screen for prostate and testicular cancer.  Talk to your health  care provider about any symptoms or concerns you have about testicular or prostate cancer.  Skin Cancer  Check your skin from head to toe regularly.  Tell your health care provider about any new moles or changes in moles, especially if: ? There is a change in a mole's size, shape, or color. ? You have a mole that is larger than a pencil eraser.  Always use sunscreen. Apply sunscreen liberally and repeat throughout the day.  Protect yourself by wearing long sleeves, pants, a wide-brimmed hat, and sunglasses when outside.

## 2019-08-21 ENCOUNTER — Other Ambulatory Visit: Payer: Self-pay | Admitting: Medical

## 2019-08-21 DIAGNOSIS — R7989 Other specified abnormal findings of blood chemistry: Secondary | ICD-10-CM

## 2019-08-21 DIAGNOSIS — K760 Fatty (change of) liver, not elsewhere classified: Secondary | ICD-10-CM

## 2019-08-21 LAB — COMPREHENSIVE METABOLIC PANEL
ALT: 92 IU/L — ABNORMAL HIGH (ref 0–44)
AST: 45 IU/L — ABNORMAL HIGH (ref 0–40)
Albumin/Globulin Ratio: 2.1 (ref 1.2–2.2)
Albumin: 4.9 g/dL (ref 4.1–5.2)
Alkaline Phosphatase: 74 IU/L (ref 39–117)
BUN/Creatinine Ratio: 11 (ref 9–20)
BUN: 11 mg/dL (ref 6–20)
Bilirubin Total: 0.7 mg/dL (ref 0.0–1.2)
CO2: 23 mmol/L (ref 20–29)
Calcium: 9.8 mg/dL (ref 8.7–10.2)
Chloride: 100 mmol/L (ref 96–106)
Creatinine, Ser: 1.01 mg/dL (ref 0.76–1.27)
GFR calc Af Amer: 115 mL/min/{1.73_m2} (ref >59)
GFR calc non Af Amer: 99 mL/min/{1.73_m2} (ref >59)
Globulin, Total: 2.3 g/dL (ref 1.5–4.5)
Glucose: 93 mg/dL (ref 65–99)
Potassium: 4.5 mmol/L (ref 3.5–5.2)
Sodium: 138 mmol/L (ref 134–144)
Total Protein: 7.2 g/dL (ref 6.0–8.5)

## 2019-08-21 LAB — LIPID PANEL
Chol/HDL Ratio: 5.1 ratio — ABNORMAL HIGH (ref 0.0–5.0)
Cholesterol, Total: 226 mg/dL — ABNORMAL HIGH (ref 100–199)
HDL: 44 mg/dL (ref >39)
LDL Chol Calc (NIH): 165 mg/dL — ABNORMAL HIGH (ref 0–99)
Triglycerides: 96 mg/dL (ref 0–149)
VLDL Cholesterol Cal: 17 mg/dL (ref 5–40)

## 2019-08-21 LAB — CBC WITH DIFFERENTIAL/PLATELET
Basophils Absolute: 0 10*3/uL (ref 0.0–0.2)
Basos: 1 %
EOS (ABSOLUTE): 0.1 10*3/uL (ref 0.0–0.4)
Eos: 2 %
Hematocrit: 47.7 % (ref 37.5–51.0)
Hemoglobin: 17.2 g/dL (ref 13.0–17.7)
Immature Grans (Abs): 0 10*3/uL (ref 0.0–0.1)
Immature Granulocytes: 0 %
Lymphocytes Absolute: 1.1 10*3/uL (ref 0.7–3.1)
Lymphs: 21 %
MCH: 33.8 pg — ABNORMAL HIGH (ref 26.6–33.0)
MCHC: 36.1 g/dL — ABNORMAL HIGH (ref 31.5–35.7)
MCV: 94 fL (ref 79–97)
Monocytes Absolute: 0.5 10*3/uL (ref 0.1–0.9)
Monocytes: 9 %
Neutrophils Absolute: 3.5 10*3/uL (ref 1.4–7.0)
Neutrophils: 67 %
Platelets: 279 10*3/uL (ref 150–450)
RBC: 5.09 x10E6/uL (ref 4.14–5.80)
RDW: 11.8 % (ref 11.6–15.4)
WBC: 5.1 10*3/uL (ref 3.4–10.8)

## 2019-08-21 LAB — TSH: TSH: 2.77 u[IU]/mL (ref 0.450–4.500)

## 2019-08-24 DIAGNOSIS — F4323 Adjustment disorder with mixed anxiety and depressed mood: Secondary | ICD-10-CM | POA: Diagnosis not present

## 2019-08-25 ENCOUNTER — Telehealth: Payer: Self-pay | Admitting: Medical

## 2019-08-25 NOTE — Telephone Encounter (Signed)
Pt needs refill on Adderall 30 mg [facility] the [facility] on [address]. He also want to see if he could get a refill on the adderall instant release 10 mg

## 2019-08-26 ENCOUNTER — Other Ambulatory Visit: Payer: Self-pay | Admitting: Medical

## 2019-08-26 MED ORDER — AMPHETAMINE-DEXTROAMPHET ER 30 MG PO CP24: 60.0000 mg | ORAL_CAPSULE | Freq: Every day | ORAL | 0 refills | Status: DC

## 2019-08-26 NOTE — Telephone Encounter (Signed)
Message was sent to mychart 

## 2019-08-26 NOTE — Telephone Encounter (Signed)
I sent the XR Adderall he has been taking regularly.  We are referring to GI for eval on the liver.  I don't feel comfortable adding back additional 10mg  regular release given his heart rate already runs up close to high end of normal as it is and we don't really have a full understanding of the liver issue.    Lets stay with the current dosing for now.

## 2019-09-03 DIAGNOSIS — F4323 Adjustment disorder with mixed anxiety and depressed mood: Secondary | ICD-10-CM | POA: Diagnosis not present

## 2019-09-08 DIAGNOSIS — F4323 Adjustment disorder with mixed anxiety and depressed mood: Secondary | ICD-10-CM | POA: Diagnosis not present

## 2019-09-18 DIAGNOSIS — K760 Fatty (change of) liver, not elsewhere classified: Secondary | ICD-10-CM

## 2019-09-18 HISTORY — DX: Fatty (change of) liver, not elsewhere classified: K76.0

## 2019-09-21 DIAGNOSIS — F4323 Adjustment disorder with mixed anxiety and depressed mood: Secondary | ICD-10-CM | POA: Diagnosis not present

## 2019-09-22 ENCOUNTER — Other Ambulatory Visit: Payer: Self-pay

## 2019-09-28 ENCOUNTER — Telehealth: Payer: Self-pay

## 2019-09-28 ENCOUNTER — Other Ambulatory Visit: Payer: Self-pay | Admitting: Medical

## 2019-09-28 NOTE — Telephone Encounter (Signed)
Please call pharmacy.  I believe that I had sent 3 separate scripts in December postdated, with the last 2 for January and February, so I think he is good for January and February with refills

## 2019-09-28 NOTE — Telephone Encounter (Signed)
Pt. Called stating he needs a refill on his Adderall to [facility].

## 2019-09-28 NOTE — Telephone Encounter (Signed)
I checked with the pharmacy and they did have a refill for him for Jan. And Feb. Still and I made the pt. Aware that he still has 2 refills left for this month and next month.

## 2019-10-01 DIAGNOSIS — F4323 Adjustment disorder with mixed anxiety and depressed mood: Secondary | ICD-10-CM | POA: Diagnosis not present

## 2019-10-02 ENCOUNTER — Telehealth: Payer: Self-pay | Admitting: Medical

## 2019-10-02 ENCOUNTER — Encounter: Payer: Self-pay | Admitting: Medical

## 2019-10-02 NOTE — Telephone Encounter (Signed)
(  see GI message)  Recently we referred you to a specialist.  I received notification from the specialist that they tried numerous times to contact you for an appointment, but they were unable to reach you.  I wanted to make sure everything was ok, to see if your phone number had changed, or to see if you were still planning to see the specialist.  If you do still intend to see the specialist, please contact them to reschedule.  Keep in mind that some specialists won't allow you to reschedule if you don't show up for a scheduled appointment or if you don't reply to their phone calls or attempts to contact you.

## 2019-10-12 ENCOUNTER — Telehealth: Payer: Self-pay | Admitting: Medical

## 2019-10-12 NOTE — Telephone Encounter (Signed)
Received requested records from [facility]  

## 2019-10-15 DIAGNOSIS — F4323 Adjustment disorder with mixed anxiety and depressed mood: Secondary | ICD-10-CM | POA: Diagnosis not present

## 2019-10-29 DIAGNOSIS — F4323 Adjustment disorder with mixed anxiety and depressed mood: Secondary | ICD-10-CM | POA: Diagnosis not present

## 2019-11-12 DIAGNOSIS — F4323 Adjustment disorder with mixed anxiety and depressed mood: Secondary | ICD-10-CM | POA: Diagnosis not present

## 2019-11-26 DIAGNOSIS — F4323 Adjustment disorder with mixed anxiety and depressed mood: Secondary | ICD-10-CM | POA: Diagnosis not present

## 2019-11-27 ENCOUNTER — Telehealth: Payer: Self-pay | Admitting: Medical

## 2019-11-27 ENCOUNTER — Other Ambulatory Visit: Payer: Self-pay | Admitting: Medical

## 2019-11-27 MED ORDER — AMPHETAMINE-DEXTROAMPHET ER 30 MG PO CP24: 60.0000 mg | ORAL_CAPSULE | Freq: Every day | ORAL | 0 refills | Status: DC

## 2019-11-27 NOTE — Telephone Encounter (Signed)
Pt called and is requesting a refill on his adderall please send to the [id number] - [place], [place] - [address] AT [facility] OF [address] & [address]

## 2019-12-17 DIAGNOSIS — F4323 Adjustment disorder with mixed anxiety and depressed mood: Secondary | ICD-10-CM | POA: Diagnosis not present

## 2019-12-25 ENCOUNTER — Other Ambulatory Visit: Payer: Self-pay | Admitting: Medical

## 2019-12-25 ENCOUNTER — Telehealth: Payer: Self-pay | Admitting: Medical

## 2019-12-25 MED ORDER — AMPHETAMINE-DEXTROAMPHET ER 30 MG PO CP24: 60.0000 mg | ORAL_CAPSULE | Freq: Every day | ORAL | 0 refills | Status: DC

## 2019-12-25 NOTE — Telephone Encounter (Signed)
Pt called needs refill on adderall XR  [facility]

## 2019-12-29 DIAGNOSIS — F4323 Adjustment disorder with mixed anxiety and depressed mood: Secondary | ICD-10-CM | POA: Diagnosis not present

## 2020-01-25 ENCOUNTER — Other Ambulatory Visit: Payer: Self-pay

## 2020-01-25 ENCOUNTER — Telehealth: Payer: Self-pay | Admitting: Medical

## 2020-01-25 DIAGNOSIS — F4323 Adjustment disorder with mixed anxiety and depressed mood: Secondary | ICD-10-CM | POA: Diagnosis not present

## 2020-01-26 MED ORDER — AMPHETAMINE-DEXTROAMPHET ER 30 MG PO CP24: 60.0000 mg | ORAL_CAPSULE | Freq: Every day | ORAL | 0 refills | Status: DC

## 2020-01-26 NOTE — Telephone Encounter (Signed)
Get in for med check.  I want to make sure he had seen gastroenterology about the elevated liver test from last time we checked labs

## 2020-01-27 NOTE — Telephone Encounter (Signed)
error 

## 2020-02-01 DIAGNOSIS — M9902 Segmental and somatic dysfunction of thoracic region: Secondary | ICD-10-CM | POA: Diagnosis not present

## 2020-02-01 DIAGNOSIS — M609 Myositis, unspecified: Secondary | ICD-10-CM | POA: Diagnosis not present

## 2020-02-01 DIAGNOSIS — M9904 Segmental and somatic dysfunction of sacral region: Secondary | ICD-10-CM | POA: Diagnosis not present

## 2020-02-01 DIAGNOSIS — M62830 Muscle spasm of back: Secondary | ICD-10-CM | POA: Diagnosis not present

## 2020-02-04 DIAGNOSIS — M62830 Muscle spasm of back: Secondary | ICD-10-CM | POA: Diagnosis not present

## 2020-02-04 DIAGNOSIS — M9902 Segmental and somatic dysfunction of thoracic region: Secondary | ICD-10-CM | POA: Diagnosis not present

## 2020-02-04 DIAGNOSIS — M9904 Segmental and somatic dysfunction of sacral region: Secondary | ICD-10-CM | POA: Diagnosis not present

## 2020-02-04 DIAGNOSIS — M609 Myositis, unspecified: Secondary | ICD-10-CM | POA: Diagnosis not present

## 2020-02-09 DIAGNOSIS — M9902 Segmental and somatic dysfunction of thoracic region: Secondary | ICD-10-CM | POA: Diagnosis not present

## 2020-02-09 DIAGNOSIS — M9904 Segmental and somatic dysfunction of sacral region: Secondary | ICD-10-CM | POA: Diagnosis not present

## 2020-02-09 DIAGNOSIS — M609 Myositis, unspecified: Secondary | ICD-10-CM | POA: Diagnosis not present

## 2020-02-09 DIAGNOSIS — M62830 Muscle spasm of back: Secondary | ICD-10-CM | POA: Diagnosis not present

## 2020-02-11 DIAGNOSIS — M609 Myositis, unspecified: Secondary | ICD-10-CM | POA: Diagnosis not present

## 2020-02-11 DIAGNOSIS — M62830 Muscle spasm of back: Secondary | ICD-10-CM | POA: Diagnosis not present

## 2020-02-11 DIAGNOSIS — M9902 Segmental and somatic dysfunction of thoracic region: Secondary | ICD-10-CM | POA: Diagnosis not present

## 2020-02-11 DIAGNOSIS — M9904 Segmental and somatic dysfunction of sacral region: Secondary | ICD-10-CM | POA: Diagnosis not present

## 2020-02-16 DIAGNOSIS — M9904 Segmental and somatic dysfunction of sacral region: Secondary | ICD-10-CM | POA: Diagnosis not present

## 2020-02-16 DIAGNOSIS — M609 Myositis, unspecified: Secondary | ICD-10-CM | POA: Diagnosis not present

## 2020-02-16 DIAGNOSIS — M9902 Segmental and somatic dysfunction of thoracic region: Secondary | ICD-10-CM | POA: Diagnosis not present

## 2020-02-16 DIAGNOSIS — M62830 Muscle spasm of back: Secondary | ICD-10-CM | POA: Diagnosis not present

## 2020-02-18 ENCOUNTER — Other Ambulatory Visit: Payer: Self-pay

## 2020-02-18 ENCOUNTER — Ambulatory Visit (INDEPENDENT_AMBULATORY_CARE_PROVIDER_SITE_OTHER): Payer: BC Managed Care – PPO | Admitting: Medical

## 2020-02-18 ENCOUNTER — Encounter: Payer: Self-pay | Admitting: Medical

## 2020-02-18 VITALS — BP 142/84 | HR 105 | Ht 70.0 in | Wt 223.8 lb

## 2020-02-18 DIAGNOSIS — M609 Myositis, unspecified: Secondary | ICD-10-CM | POA: Diagnosis not present

## 2020-02-18 DIAGNOSIS — Z79899 Other long term (current) drug therapy: Secondary | ICD-10-CM

## 2020-02-18 DIAGNOSIS — R7989 Other specified abnormal findings of blood chemistry: Secondary | ICD-10-CM | POA: Diagnosis not present

## 2020-02-18 DIAGNOSIS — M62830 Muscle spasm of back: Secondary | ICD-10-CM | POA: Diagnosis not present

## 2020-02-18 DIAGNOSIS — I10 Essential (primary) hypertension: Secondary | ICD-10-CM

## 2020-02-18 DIAGNOSIS — R000 Tachycardia, unspecified: Secondary | ICD-10-CM | POA: Diagnosis not present

## 2020-02-18 DIAGNOSIS — M9904 Segmental and somatic dysfunction of sacral region: Secondary | ICD-10-CM | POA: Diagnosis not present

## 2020-02-18 DIAGNOSIS — F988 Other specified behavioral and emotional disorders with onset usually occurring in childhood and adolescence: Secondary | ICD-10-CM

## 2020-02-18 DIAGNOSIS — K760 Fatty (change of) liver, not elsewhere classified: Secondary | ICD-10-CM

## 2020-02-18 DIAGNOSIS — M9902 Segmental and somatic dysfunction of thoracic region: Secondary | ICD-10-CM | POA: Diagnosis not present

## 2020-02-18 MED ORDER — ATENOLOL 25 MG PO TABS
25.0000 mg | ORAL_TABLET | Freq: Every day | ORAL | 2 refills | Status: DC
Start: 2020-02-18 — End: 2020-05-13

## 2020-02-18 MED ORDER — AMPHETAMINE-DEXTROAMPHET ER 30 MG PO CP24: 60.0000 mg | ORAL_CAPSULE | Freq: Every day | ORAL | 0 refills | Status: DC

## 2020-02-18 NOTE — Patient Instructions (Addendum)
Encounter Diagnoses  Name Primary?  . Elevated LFTs Yes  . Essential hypertension   . Tachycardia   . Fatty liver   . Attention deficit disorder, unspecified hyperactivity presence   . High risk medication use     Recommendations:  Limit fast food and junk food and sweets  eat a healthy low-fat diet  Cut down or limit animal products in general  Limit alcohol  Limit salt intake  Exercise regularly  Work on losing 10 to 15 pounds in the next 3 months  Return next week for updated liver test, nurse visit for labs  We need to consider adding a blood pressure pill to lower pulse and blood pressure versus changing your formulation of your stimulant medication  Currently your blood pressure and pulse are higher than they should be which puts you at risk for heart disease  We need to take steps to improve this now to avoid complications  Listed other handouts below to give you more information about complications of high blood pressure and pulse    Hypertension, Adult Hypertension is another name for high blood pressure. High blood pressure forces your heart to work harder to pump blood. This can cause problems over time. There are two numbers in a blood pressure reading. There is a top number (systolic) over a bottom number (diastolic). It is best to have a blood pressure that is below 120/80. Healthy choices can help lower your blood pressure, or you may need medicine to help lower it. What are the causes? The cause of this condition is not known. Some conditions may be related to high blood pressure. What increases the risk?  Smoking.  Having type 2 diabetes mellitus, high cholesterol, or both.  Not getting enough exercise or physical activity.  Being overweight.  Having too much fat, sugar, calories, or salt (sodium) in your diet.  Drinking too much alcohol.  Having long-term (chronic) kidney disease.  Having a family history of high blood pressure.  Age.  Risk increases with age.  Race. You may be at higher risk if you are African American.  Gender. Men are at higher risk than women before age 66. After age 78, women are at higher risk than men.  Having obstructive sleep apnea.  Stress. What are the signs or symptoms?  High blood pressure may not cause symptoms. Very high blood pressure (hypertensive crisis) may cause: ? Headache. ? Feelings of worry or nervousness (anxiety). ? Shortness of breath. ? Nosebleed. ? A feeling of being sick to your stomach (nausea). ? Throwing up (vomiting). ? Changes in how you see. ? Very bad chest pain. ? Seizures. How is this treated?  This condition is treated by making healthy lifestyle changes, such as: ? Eating healthy foods. ? Exercising more. ? Drinking less alcohol.  Your health care provider may prescribe medicine if lifestyle changes are not enough to get your blood pressure under control, and if: ? Your top number is above 130. ? Your bottom number is above 80.  Your personal target blood pressure may vary. Follow these instructions at home: Eating and drinking   If told, follow the DASH eating plan. To follow this plan: ? Fill one half of your plate at each meal with fruits and vegetables. ? Fill one fourth of your plate at each meal with whole grains. Whole grains include whole-wheat pasta, brown rice, and whole-grain bread. ? Eat or drink low-fat dairy products, such as skim milk or low-fat yogurt. ? Fill one  fourth of your plate at each meal with low-fat (lean) proteins. Low-fat proteins include fish, chicken without skin, eggs, beans, and tofu. ? Avoid fatty meat, cured and processed meat, or chicken with skin. ? Avoid pre-made or processed food.  Eat less than 1,500 mg of salt each day.  Do not drink alcohol if: ? Your doctor tells you not to drink. ? You are pregnant, may be pregnant, or are planning to become pregnant.  If you drink alcohol: ? Limit how much you  use to:  0-1 drink a day for women.  0-2 drinks a day for men. ? Be aware of how much alcohol is in your drink. In the [place] one drink equals one 12 oz bottle of beer (355 mL), one 5 oz glass of wine (148 mL), or one 1 oz glass of hard liquor (44 mL). Lifestyle   Work with your doctor to stay at a healthy weight or to lose weight. Ask your doctor what the best weight is for you.  Get at least 30 minutes of exercise most days of the week. This may include walking, swimming, or biking.  Get at least 30 minutes of exercise that strengthens your muscles (resistance exercise) at least 3 days a week. This may include lifting weights or doing Pilates.  Do not use any products that contain nicotine or tobacco, such as cigarettes, e-cigarettes, and chewing tobacco. If you need help quitting, ask your doctor.  Check your blood pressure at home as told by your doctor.  Keep all follow-up visits as told by your doctor. This is important. Medicines  Take over-the-counter and prescription medicines only as told by your doctor. Follow directions carefully.  Do not skip doses of blood pressure medicine. The medicine does not work as well if you skip doses. Skipping doses also puts you at risk for problems.  Ask your doctor about side effects or reactions to medicines that you should watch for. Contact a doctor if you:  Think you are having a reaction to the medicine you are taking.  Have headaches that keep coming back (recurring).  Feel dizzy.  Have swelling in your ankles.  Have trouble with your vision. Get help right away if you:  Get a very bad headache.  Start to feel mixed up (confused).  Feel weak or numb.  Feel faint.  Have very bad pain in your: ? Chest. ? Belly (abdomen).  Throw up more than once.  Have trouble breathing. Summary  Hypertension is another name for high blood pressure.  High blood pressure forces your heart to work harder to pump blood.  For  most people, a normal blood pressure is less than 120/80.  Making healthy choices can help lower blood pressure. If your blood pressure does not get lower with healthy choices, you may need to take medicine. This information is not intended to replace advice given to you by your health care provider. Make sure you discuss any questions you have with your health care provider. Document Revised: 05/14/2018 Document Reviewed: 05/14/2018 Elsevier Patient Education  [employer].      Sinus Tachycardia  Sinus tachycardia is a kind of fast heartbeat. In sinus tachycardia, the heart beats more than 100 times a minute. Sinus tachycardia starts in a part of the heart called the sinus node. Sinus tachycardia may be harmless, or it may be a sign of a serious condition. What are the causes? This condition may be caused by:  Exercise or exertion.  A  fever.  Pain.  Loss of body fluids (dehydration).  Severe bleeding (hemorrhage).  Anxiety and stress.  Certain substances, including: ? Alcohol. ? Caffeine. ? Tobacco and nicotine products. ? Cold medicines. ? Illegal drugs.  Medical conditions including: ? Heart disease. ? An infection. ? An overactive thyroid (hyperthyroidism). ? A lack of red blood cells (anemia). What are the signs or symptoms? Symptoms of this condition include:  A feeling that the heart is beating quickly (palpitations).  Suddenly noticing your heartbeat (cardiac awareness).  Dizziness.  Tiredness (fatigue).  Shortness of breath.  Chest pain.  Nausea.  Fainting. How is this diagnosed? This condition is diagnosed with:  A physical exam.  Other tests, such as: ? Blood tests. ? An electrocardiogram (ECG). This test measures the electrical activity of the heart. ? Ambulatory cardiac monitor. This records your heartbeats for 24 hours or more. You may be referred to a heart specialist (cardiologist). How is this treated? Treatment for this  condition depends on the cause or the underlying condition. Treatment may involve:  Treating the underlying condition.  Taking new medicines or changing your current medicines as told by your health care provider.  Making changes to your diet or lifestyle. Follow these instructions at home: Lifestyle   Do not use any products that contain nicotine or tobacco, such as cigarettes and e-cigarettes. If you need help quitting, ask your health care provider.  Do not use illegal drugs, such as cocaine.  Learn relaxation methods to help you when you get stressed or anxious. These include deep breathing.  Avoid caffeine or other stimulants. Alcohol use   Do not drink alcohol if: ? Your health care provider tells you not to drink. ? You are pregnant, may be pregnant, or are planning to become pregnant.  If you drink alcohol, limit how much you have: ? 0-1 drink a day for women. ? 0-2 drinks a day for men.  Be aware of how much alcohol is in your drink. In the [place] one drink equals one typical bottle of beer (12 oz), one-half glass of wine (5 oz), or one shot of hard liquor (1 oz). General instructions  Drink enough fluids to keep your urine pale yellow.  Take over-the-counter and prescription medicines only as told by your health care provider.  Keep all follow-up visits as told by your health care provider. This is important. Contact a health care provider if you have:  A fever.  Vomiting or diarrhea that does not go away. Get help right away if you:  Have pain in your chest, upper arms, jaw, or neck.  Become weak or dizzy.  Feel faint.  Have palpitations that do not go away. Summary  In sinus tachycardia, the heart beats more than 100 times a minute.  Sinus tachycardia may be harmless, or it may be a sign of a serious condition.  Treatment for this condition depends on the cause or the underlying condition.  Get help right away if you have pain in your chest,  upper arms, jaw, or neck. This information is not intended to replace advice given to you by your health care provider. Make sure you discuss any questions you have with your health care provider. Document Revised: 10/23/2017 Document Reviewed: 10/23/2017 Elsevier Patient Education  2020 [employer] Inc.     Fatty Liver Disease  Fatty liver disease occurs when too much fat has built up in your liver cells. Fatty liver disease is also called hepatic steatosis or steatohepatitis. The liver removes  harmful substances from your bloodstream and produces fluids that your body needs. It also helps your body use and store energy from the food you eat. In many cases, fatty liver disease does not cause symptoms or problems. It is often diagnosed when tests are being done for other reasons. However, over time, fatty liver can cause inflammation that may lead to more serious liver problems, such as scarring of the liver (cirrhosis) and liver failure. Fatty liver is associated with insulin resistance, increased body fat, high blood pressure (hypertension), and high cholesterol. These are features of metabolic syndrome and increase your risk for stroke, diabetes, and heart disease. What are the causes? This condition may be caused by:  Drinking too much alcohol.  Poor nutrition.  Obesity.  Cushing's syndrome.  Diabetes.  High cholesterol.  Certain drugs.  Poisons.  Some viral infections.  Pregnancy. What increases the risk? You are more likely to develop this condition if you:  Abuse alcohol.  Are overweight.  Have diabetes.  Have hepatitis.  Have a high triglyceride level.  Are pregnant. What are the signs or symptoms? Fatty liver disease often does not cause symptoms. If symptoms do develop, they can include:  Fatigue.  Weakness.  Weight loss.  Confusion.  Abdominal pain.  Nausea and vomiting.  Yellowing of your skin and the white parts of your eyes  (jaundice).  Itchy skin. How is this diagnosed? This condition may be diagnosed by:  A physical exam and medical history.  Blood tests.  Imaging tests, such as an ultrasound, CT scan, or MRI.  A liver biopsy. A small sample of liver tissue is removed using a needle. The sample is then looked at under a microscope. How is this treated? Fatty liver disease is often caused by other health conditions. Treatment for fatty liver may involve medicines and lifestyle changes to manage conditions such as:  Alcoholism.  High cholesterol.  Diabetes.  Being overweight or obese. Follow these instructions at home:   Do not drink alcohol. If you have trouble quitting, ask your health care provider how to safely quit with the help of medicine or a supervised program. This is important to keep your condition from getting worse.  Eat a healthy diet as told by your health care provider. Ask your health care provider about working with a diet and nutrition specialist (dietitian) to develop an eating plan.  Exercise regularly. This can help you lose weight and control your cholesterol and diabetes. Talk to your health care provider about an exercise plan and which activities are best for you.  Take over-the-counter and prescription medicines only as told by your health care provider.  Keep all follow-up visits as told by your health care provider. This is important. Contact a health care provider if: You have trouble controlling your:  Blood sugar. This is especially important if you have diabetes.  Cholesterol.  Drinking of alcohol. Get help right away if:  You have abdominal pain.  You have jaundice.  You have nausea and vomiting.  You vomit blood or material that looks like coffee grounds.  You have stools that are black, tar-like, or bloody. Summary  Fatty liver disease develops when too much fat builds up in the cells of your liver.  Fatty liver disease often causes no  symptoms or problems. However, over time, fatty liver can cause inflammation that may lead to more serious liver problems, such as scarring of the liver (cirrhosis).  You are more likely to develop this condition if  you abuse alcohol, are pregnant, are overweight, have diabetes, have hepatitis, or have high triglyceride levels.  Contact your health care provider if you have trouble controlling your weight, blood sugar, cholesterol, or drinking of alcohol. This information is not intended to replace advice given to you by your health care provider. Make sure you discuss any questions you have with your health care provider. Document Revised: 08/16/2017 Document Reviewed: 06/12/2017 Elsevier Patient Education  2020 [employer]

## 2020-02-18 NOTE — Progress Notes (Signed)
Subjective:  Anthony Pena is a 29 y.o. male who presents for Chief Complaint  Patient presents with   Medication Management     Here for med check  Attention deficit disorder-continues on Adderall XR 2 a day, has been on the same dose for many years.  This seems to work well for him.  No concerns with sleep or appetite  Here to follow-up on elevated liver test, fatty liver disease.  Since last visit he notes that he is eating healthier and has cut back on alcohol although he still drinks somewhat regularly.  For example he drank beer and liquor of the [place] Day holiday.  He knows he needs to do better with his diet.  He still eats probably more unhealthy than healthy.  His chiropractor recently got onto him about his diet as well  He recently had a [employer] vaccine.  He actually had Covid infection back in December early January  No other aggravating or relieving factors.    No other c/o.  The following portions of the patient's history were reviewed and updated as appropriate: allergies, current medications, past family history, past medical history, past social history, past surgical history and problem list.  ROS Otherwise as in subjective above  Objective: BP (!) 142/84    Pulse (!) 105    Ht 5\' 10"  (1.778 m)    Wt 223 lb 12.8 oz (101.5 kg)    SpO2 98%    BMI 32.11 kg/m   General appearance: alert, no distress, well developed, well nourished Heart: RRR, normal S1, S2, no murmurs Lungs: CTA bilaterally, no wheezes, rhonchi, or rales Pulses: 2+ radial pulses, 2+ pedal pulses, normal cap refill Ext: no edema   Assessment: Encounter Diagnoses  Name Primary?   Elevated LFTs Yes   Essential hypertension    Tachycardia    Fatty liver    Attention deficit disorder, unspecified hyperactivity presence    High risk medication use      Plan: We discussed several concerns today.  At this point over the course of the last year we ruled out hepatitis infection or  iron storage disease.  His liver tests have continued to be elevated likely due to the combination of alcohol use, diet and possibly other underlying issues.  Last visit I had recommended GI consult but he never followed up with this.  He will come in next week for liver test repeat since he drank alcohol this past weekend  I advise if his liver tests are still elevated this next repeat and he should see GI to rule out other issues  In the meantime we discussed the possible complications long-term of fatty liver disease, the need to lose weight, eat healthy, eat a healthy low-fat diet, significantly limit alcohol  After several visits now he is continue to have elevated blood pressures and elevated pulse.  He does not seem to be as concerned about this as I am.  We discussed that given these findings, the fact that he is on high-dose stimulant medication and given the history of relatively higher than normal alcohol use, we cannot just simply ignore this issue.  We discussed either modifying his stimulant medicine to something a little less potent or adding a beta-blocker.    He has been on the same dose of Adderall for years and feels like this works well so he did not want to change it at this time.  We discussed possibly trying  Mydayis as an option to  Adderall going forward.    For now begin trial of atenolol once daily as below.  Discussed risk and benefits of medication.  We discussed risk of stimulant medicine.  We discussed complications and long-term risk of elevated blood pressure and elevated pulse   Anthony Pena was seen today for medication management.  Diagnoses and all orders for this visit:  Elevated LFTs -     Hepatic function panel; Future  Essential hypertension  Tachycardia  Fatty liver -     Hepatic function panel; Future  Attention deficit disorder, unspecified hyperactivity presence  High risk medication use  Other orders -     atenolol (TENORMIN) 25 MG tablet; Take 1  tablet (25 mg total) by mouth daily. -     amphetamine-dextroamphetamine (ADDERALL XR) 30 MG 24 hr capsule; Take 2 capsules (60 mg total) by mouth daily.    Follow up: next week for labs

## 2020-02-22 DIAGNOSIS — F4323 Adjustment disorder with mixed anxiety and depressed mood: Secondary | ICD-10-CM | POA: Diagnosis not present

## 2020-02-22 DIAGNOSIS — M9902 Segmental and somatic dysfunction of thoracic region: Secondary | ICD-10-CM | POA: Diagnosis not present

## 2020-02-22 DIAGNOSIS — M62830 Muscle spasm of back: Secondary | ICD-10-CM | POA: Diagnosis not present

## 2020-02-22 DIAGNOSIS — M9904 Segmental and somatic dysfunction of sacral region: Secondary | ICD-10-CM | POA: Diagnosis not present

## 2020-02-22 DIAGNOSIS — M609 Myositis, unspecified: Secondary | ICD-10-CM | POA: Diagnosis not present

## 2020-02-24 DIAGNOSIS — M62830 Muscle spasm of back: Secondary | ICD-10-CM | POA: Diagnosis not present

## 2020-02-24 DIAGNOSIS — M9904 Segmental and somatic dysfunction of sacral region: Secondary | ICD-10-CM | POA: Diagnosis not present

## 2020-02-24 DIAGNOSIS — M9902 Segmental and somatic dysfunction of thoracic region: Secondary | ICD-10-CM | POA: Diagnosis not present

## 2020-02-24 DIAGNOSIS — M609 Myositis, unspecified: Secondary | ICD-10-CM | POA: Diagnosis not present

## 2020-02-25 ENCOUNTER — Other Ambulatory Visit: Payer: BC Managed Care – PPO

## 2020-02-25 ENCOUNTER — Other Ambulatory Visit: Payer: Self-pay

## 2020-02-25 DIAGNOSIS — R7989 Other specified abnormal findings of blood chemistry: Secondary | ICD-10-CM | POA: Diagnosis not present

## 2020-02-25 DIAGNOSIS — K760 Fatty (change of) liver, not elsewhere classified: Secondary | ICD-10-CM

## 2020-02-26 LAB — HEPATIC FUNCTION PANEL
ALT: 144 IU/L — ABNORMAL HIGH (ref 0–44)
AST: 71 IU/L — ABNORMAL HIGH (ref 0–40)
Albumin: 4.7 g/dL (ref 4.1–5.2)
Alkaline Phosphatase: 68 IU/L (ref 48–121)
Bilirubin Total: 0.7 mg/dL (ref 0.0–1.2)
Bilirubin, Direct: 0.22 mg/dL (ref 0.00–0.40)
Total Protein: 7.2 g/dL (ref 6.0–8.5)

## 2020-02-29 ENCOUNTER — Other Ambulatory Visit: Payer: Self-pay | Admitting: Medical

## 2020-02-29 DIAGNOSIS — M609 Myositis, unspecified: Secondary | ICD-10-CM | POA: Diagnosis not present

## 2020-02-29 DIAGNOSIS — Z789 Other specified health status: Secondary | ICD-10-CM

## 2020-02-29 DIAGNOSIS — K760 Fatty (change of) liver, not elsewhere classified: Secondary | ICD-10-CM

## 2020-02-29 DIAGNOSIS — R7989 Other specified abnormal findings of blood chemistry: Secondary | ICD-10-CM

## 2020-02-29 DIAGNOSIS — Z7289 Other problems related to lifestyle: Secondary | ICD-10-CM

## 2020-02-29 DIAGNOSIS — M9904 Segmental and somatic dysfunction of sacral region: Secondary | ICD-10-CM | POA: Diagnosis not present

## 2020-02-29 DIAGNOSIS — M9902 Segmental and somatic dysfunction of thoracic region: Secondary | ICD-10-CM | POA: Diagnosis not present

## 2020-02-29 DIAGNOSIS — M62830 Muscle spasm of back: Secondary | ICD-10-CM | POA: Diagnosis not present

## 2020-03-02 DIAGNOSIS — M62830 Muscle spasm of back: Secondary | ICD-10-CM | POA: Diagnosis not present

## 2020-03-02 DIAGNOSIS — M9904 Segmental and somatic dysfunction of sacral region: Secondary | ICD-10-CM | POA: Diagnosis not present

## 2020-03-02 DIAGNOSIS — M609 Myositis, unspecified: Secondary | ICD-10-CM | POA: Diagnosis not present

## 2020-03-02 DIAGNOSIS — M9902 Segmental and somatic dysfunction of thoracic region: Secondary | ICD-10-CM | POA: Diagnosis not present

## 2020-03-14 DIAGNOSIS — M9904 Segmental and somatic dysfunction of sacral region: Secondary | ICD-10-CM | POA: Diagnosis not present

## 2020-03-14 DIAGNOSIS — M609 Myositis, unspecified: Secondary | ICD-10-CM | POA: Diagnosis not present

## 2020-03-14 DIAGNOSIS — M62830 Muscle spasm of back: Secondary | ICD-10-CM | POA: Diagnosis not present

## 2020-03-14 DIAGNOSIS — M9902 Segmental and somatic dysfunction of thoracic region: Secondary | ICD-10-CM | POA: Diagnosis not present

## 2020-03-24 DIAGNOSIS — M9904 Segmental and somatic dysfunction of sacral region: Secondary | ICD-10-CM | POA: Diagnosis not present

## 2020-03-24 DIAGNOSIS — M9901 Segmental and somatic dysfunction of cervical region: Secondary | ICD-10-CM | POA: Diagnosis not present

## 2020-03-24 DIAGNOSIS — F4323 Adjustment disorder with mixed anxiety and depressed mood: Secondary | ICD-10-CM | POA: Diagnosis not present

## 2020-03-24 DIAGNOSIS — M609 Myositis, unspecified: Secondary | ICD-10-CM | POA: Diagnosis not present

## 2020-03-24 DIAGNOSIS — M62830 Muscle spasm of back: Secondary | ICD-10-CM | POA: Diagnosis not present

## 2020-03-30 DIAGNOSIS — M62830 Muscle spasm of back: Secondary | ICD-10-CM | POA: Diagnosis not present

## 2020-03-30 DIAGNOSIS — M609 Myositis, unspecified: Secondary | ICD-10-CM | POA: Diagnosis not present

## 2020-03-30 DIAGNOSIS — M9904 Segmental and somatic dysfunction of sacral region: Secondary | ICD-10-CM | POA: Diagnosis not present

## 2020-03-30 DIAGNOSIS — M9901 Segmental and somatic dysfunction of cervical region: Secondary | ICD-10-CM | POA: Diagnosis not present

## 2020-04-13 DIAGNOSIS — M9904 Segmental and somatic dysfunction of sacral region: Secondary | ICD-10-CM | POA: Diagnosis not present

## 2020-04-13 DIAGNOSIS — M9901 Segmental and somatic dysfunction of cervical region: Secondary | ICD-10-CM | POA: Diagnosis not present

## 2020-04-13 DIAGNOSIS — M62830 Muscle spasm of back: Secondary | ICD-10-CM | POA: Diagnosis not present

## 2020-04-13 DIAGNOSIS — M609 Myositis, unspecified: Secondary | ICD-10-CM | POA: Diagnosis not present

## 2020-04-21 DIAGNOSIS — F4323 Adjustment disorder with mixed anxiety and depressed mood: Secondary | ICD-10-CM | POA: Diagnosis not present

## 2020-04-25 ENCOUNTER — Other Ambulatory Visit: Payer: Self-pay

## 2020-04-25 MED ORDER — AMPHETAMINE-DEXTROAMPHET ER 30 MG PO CP24: 60.0000 mg | ORAL_CAPSULE | Freq: Every day | ORAL | 0 refills | Status: DC

## 2020-05-13 ENCOUNTER — Other Ambulatory Visit: Payer: Self-pay | Admitting: Medical

## 2020-05-13 NOTE — Telephone Encounter (Signed)
[facility] is requesting to fill pt atenolol. Please advise [facility]

## 2020-05-19 DIAGNOSIS — F4323 Adjustment disorder with mixed anxiety and depressed mood: Secondary | ICD-10-CM | POA: Diagnosis not present

## 2020-05-24 ENCOUNTER — Other Ambulatory Visit: Payer: Self-pay | Admitting: Family Medicine

## 2020-05-25 ENCOUNTER — Telehealth: Payer: Self-pay | Admitting: Medical

## 2020-05-25 MED ORDER — AMPHETAMINE-DEXTROAMPHET ER 30 MG PO CP24: 60.0000 mg | ORAL_CAPSULE | Freq: Every day | ORAL | 0 refills | Status: DC

## 2020-05-25 NOTE — Telephone Encounter (Signed)
Please check on status of gastroenterology consult.  We referred last visit due to elevated liver test

## 2020-05-25 NOTE — Telephone Encounter (Signed)
Please call patient about this.  I need him to have GI consult regarding health of his liver.  Give him phone number to call them

## 2020-05-25 NOTE — Telephone Encounter (Signed)
GI stated they tried reaching patient several times. Left message but patient never called them back to schedule. His referral was closed due to not scheduling

## 2020-05-27 NOTE — Telephone Encounter (Signed)
Lmom for patient to call GI.

## 2020-06-23 ENCOUNTER — Other Ambulatory Visit: Payer: Self-pay | Admitting: Medical

## 2020-06-23 ENCOUNTER — Encounter: Payer: Self-pay | Admitting: Nurse Practitioner

## 2020-06-23 ENCOUNTER — Other Ambulatory Visit: Payer: Self-pay

## 2020-06-23 DIAGNOSIS — F4323 Adjustment disorder with mixed anxiety and depressed mood: Secondary | ICD-10-CM | POA: Diagnosis not present

## 2020-06-23 MED ORDER — AMPHETAMINE-DEXTROAMPHET ER 30 MG PO CP24: 60.0000 mg | ORAL_CAPSULE | Freq: Every day | ORAL | 0 refills | Status: DC

## 2020-06-23 NOTE — Telephone Encounter (Signed)
Patient has an appointment with Gastro scheduled.

## 2020-06-23 NOTE — Telephone Encounter (Signed)
See prior messages in the chart.  I was concerned that he was not following up with gastroenterology after last visit and last med refill.  Last refill request shows that he never answered the phone.  Please get him on the following and let us figure out why he has not seen gastroenterology yet.  This needs to happen

## 2020-07-20 NOTE — Progress Notes (Signed)
07/20/2020 Anthony Pena [id number] 08/13/1991   CHIEF COMPLAINT: Elevated LFTs  HISTORY OF PRESENT ILLNESS:  Anthony Pena is a 29 year old male with a past medical history of hypertension, hypercholesterolemia, ADD, fatty liver and elevated LFTs. He presents to our office today as referred by Crosby Oyster PA-C for further evaluation regarding elevated LFTs since 2019 and a fatty liver.  He denies having any nausea or vomiting.  No pruritus.  No jaundice.  No upper or lower abdominal pain.  No weight loss.  He is passing normal brown bowel movement daily.  No rectal bleeding or melena. A maternal uncle had hepatitis B and hepatitis C otherwise no family history of liver disease.  He drinks 1 beer nightly and on the weekends during the summer he drinks 8-10 alcoholic beverages.  No history of drug use.  His only medications include Adderall and Atenolol.  His weight is stable at 220 pounds, he acknowledges the need to lose weight. No other complaints today.  CMP Latest Ref Rng & Units 02/25/2020 08/20/2019 04/25/2018  Glucose 65 - 99 mg/dL - 93 -  BUN 6 - 20 mg/dL - 11 -  Creatinine 0.10 - 1.27 mg/dL - 9.32 -  Sodium 355 - 144 mmol/L - 138 -  Potassium 3.5 - 5.2 mmol/L - 4.5 -  Chloride 96 - 106 mmol/L - 100 -  CO2 20 - 29 mmol/L - 23 -  Calcium 8.7 - 10.2 mg/dL - 9.8 -  Total Protein 6.0 - 8.5 g/dL 7.2 7.2 7.1  Total Bilirubin 0.0 - 1.2 mg/dL 0.7 0.7 0.5  Alkaline Phos 48 - 121 IU/L 68 74 69  AST 0 - 40 IU/L 71(H) 45(H) 44(H)  ALT 0 - 44 IU/L 144(H) 92(H) 102(H)   CBC Latest Ref Rng & Units 08/20/2019 07/03/2017  WBC 3.4 - 10.8 x10E3/uL 5.1 5.1  Hemoglobin 13.0 - 17.7 g/dL 73.2 20.2  Hematocrit 54.2 - 51.0 % 47.7 48.1  Platelets 150 - 450 x10E3/uL 279 323   04/25/2018: Hepatitis B surface antigen negative.  Hepatitis C antibody 0.1.  RUQ sonogram 10/29/2017: 1. Hepatic steatosis. 2. No evidence for acute cholecystitis.   Past Medical History:  Diagnosis Date  . ADD  (attention deficit disorder)   . Childhood asthma   . Dysgraphia    childhood  . Dyslexia    childhood   Past Surgical History:  Procedure Laterality Date  . MOUTH SURGERY     Social History: He drinks one beer or liquor drink during week. Increased alcohol intake on the weekends during the summer  6 to 10. No drug use.   Family History: Father died age 83, COPD and alcoholic. Mother age 48 scoliosis, hypertension. Paternal grandmother with heart disease. brother health.  Maternal uncle with history of hepatitis B and C. Maternal grandfather prostate cancer, died from lung cance.    Allergies  Allergen Reactions  . Pollen Extract       Outpatient Encounter Medications as of 07/21/2020  Medication Sig  . amphetamine-dextroamphetamine (ADDERALL XR) 30 MG 24 hr capsule Take 2 capsules (60 mg total) by mouth daily.  Marland Kitchen amphetamine-dextroamphetamine (ADDERALL XR) 30 MG 24 hr capsule Take 2 capsules (60 mg total) by mouth daily.  Marland Kitchen amphetamine-dextroamphetamine (ADDERALL XR) 30 MG 24 hr capsule Take 2 capsules (60 mg total) by mouth daily.  Marland Kitchen atenolol (TENORMIN) 25 MG tablet TAKE 1 TABLET(25 MG) BY MOUTH DAILY   No facility-administered encounter medications on file as of  07/21/2020.     REVIEW OF SYSTEMS:  Gen: Denies fever, sweats or chills. No weight loss.  CV: Denies chest pain, palpitations or edema. Resp: Denies cough, shortness of breath of hemoptysis.  GI: Denies heartburn, dysphagia, stomach or lower abdominal pain. No diarrhea or constipation.  GU : Denies urinary burning, blood in urine, increased urinary frequency or incontinence. MS: Denies joint pain, muscles aches or weakness. Derm: Denies rash, itchiness, skin lesions or unhealing ulcers. Psych: Denies depression or anxiety. Heme: Denies bruising, bleeding. Neuro:  Denies headaches, dizziness or paresthesias. Endo:  Denies any problems with DM, thyroid or adrenal function.  PHYSICAL EXAM: BP 126/82   Pulse 96    Ht 5\' 11"  (1.803 m)   Wt 220 lb (99.8 kg)   BMI 30.68 kg/m   General: Well developed 29 year old male in no acute distress. Head: Normocephalic and atraumatic. Eyes:  Sclerae non-icteric, conjunctive pink. Ears: Normal auditory acuity. Mouth: Dentition intact. No ulcers or lesions.  Neck: Supple, no lymphadenopathy or thyromegaly.  Lungs: Clear bilaterally to auscultation without wheezes, crackles or rhonchi. Heart: Regular rate and rhythm. No murmur, rub or gallop appreciated.  Abdomen: Soft, nontender, non distended. No masses. No hepatosplenomegaly. Normoactive bowel sounds x 4 quadrants.  Rectal: Deferred. Musculoskeletal: Symmetrical with no gross deformities. Skin: Warm and dry. No rash or lesions on visible extremities. Extremities: No edema. Neurological: Alert oriented x 4, no focal deficits.  Psychological:  Alert and cooperative. Normal mood and affect.  ASSESSMENT AND PLAN:  60. 29 year old male with elevated LFTs and a fatty liver by RUQ sonogram.  -CBC, hepatic panel, GGT, iron, ferritin, ceruloplasmin, alpha-1 antitrypsin level, ANA, SMA, AMA, hepatitis A total antibody, hepatitis B surface antibody, hepatitis B core total antibody. -If not immune he will require hepatitis A and hepatitis B vaccination -Reduce alcohol intake -Exercise, weight loss amended -Discussed the risk of fatty liver disease/cirrhosis with known fatty liver with elevated LFTs -Further follow-up to be determined after the above laboratory results received    CC:  Tysinger, 36, PA-C

## 2020-07-21 ENCOUNTER — Ambulatory Visit (INDEPENDENT_AMBULATORY_CARE_PROVIDER_SITE_OTHER): Payer: BC Managed Care – PPO | Admitting: Nurse Practitioner

## 2020-07-21 ENCOUNTER — Other Ambulatory Visit: Payer: Self-pay

## 2020-07-21 ENCOUNTER — Encounter: Payer: Self-pay | Admitting: Nurse Practitioner

## 2020-07-21 ENCOUNTER — Other Ambulatory Visit (INDEPENDENT_AMBULATORY_CARE_PROVIDER_SITE_OTHER): Payer: BC Managed Care – PPO

## 2020-07-21 VITALS — BP 126/82 | HR 96 | Ht 71.0 in | Wt 220.0 lb

## 2020-07-21 DIAGNOSIS — K760 Fatty (change of) liver, not elsewhere classified: Secondary | ICD-10-CM

## 2020-07-21 DIAGNOSIS — F4323 Adjustment disorder with mixed anxiety and depressed mood: Secondary | ICD-10-CM | POA: Diagnosis not present

## 2020-07-21 DIAGNOSIS — R7989 Other specified abnormal findings of blood chemistry: Secondary | ICD-10-CM

## 2020-07-21 LAB — HEPATIC FUNCTION PANEL
ALT: 86 U/L — ABNORMAL HIGH (ref 0–53)
AST: 38 U/L — ABNORMAL HIGH (ref 0–37)
Albumin: 4.6 g/dL (ref 3.5–5.2)
Alkaline Phosphatase: 55 U/L (ref 39–117)
Bilirubin, Direct: 0.2 mg/dL (ref 0.0–0.3)
Total Bilirubin: 1.2 mg/dL (ref 0.2–1.2)
Total Protein: 7.1 g/dL (ref 6.0–8.3)

## 2020-07-21 LAB — CBC
HCT: 47.4 % (ref 39.0–52.0)
Hemoglobin: 16.4 g/dL (ref 13.0–17.0)
MCHC: 34.7 g/dL (ref 30.0–36.0)
MCV: 95.7 fl (ref 78.0–100.0)
Platelets: 260 10*3/uL (ref 150.0–400.0)
RBC: 4.95 Mil/uL (ref 4.22–5.81)
RDW: 11.6 % (ref 11.5–15.5)
WBC: 5.1 10*3/uL (ref 4.0–10.5)

## 2020-07-21 LAB — IRON: Iron: 166 ug/dL — ABNORMAL HIGH (ref 42–165)

## 2020-07-21 LAB — GAMMA GT: GGT: 72 U/L — ABNORMAL HIGH (ref 7–51)

## 2020-07-21 LAB — FERRITIN: Ferritin: 635.6 ng/mL — ABNORMAL HIGH (ref 22.0–322.0)

## 2020-07-21 MED ORDER — AMPHETAMINE-DEXTROAMPHET ER 30 MG PO CP24: 60.0000 mg | ORAL_CAPSULE | Freq: Every day | ORAL | 0 refills | Status: DC

## 2020-07-21 NOTE — Patient Instructions (Addendum)
If you are age 31 or older, your body mass index should be between 23-30. Your Body mass index is 30.68 kg/m. If this is out of the aforementioned range listed, please consider follow up with your Primary Care Provider.  If you are age 12 or younger, your body mass index should be between 19-25. Your Body mass index is 30.68 kg/m. If this is out of the aformentioned range listed, please consider follow up with your Primary Care Provider.   Your provider has requested that you go to the basement level for lab work before leaving today. Press "B" on the elevator. The lab is located at the first door on the left as you exit the elevator.  Reduce alcohol intake  Due to recent changes in healthcare laws, you may see the results of your imaging and laboratory studies on MyChart before your provider has had a chance to review them.  We understand that in some cases there may be results that are confusing or concerning to you. Not all laboratory results come back in the same time frame and the provider may be waiting for multiple results in order to interpret others.  Please give [place] 48 hours in order for your provider to thoroughly review all the results before contacting the office for clarification of your results.

## 2020-07-25 NOTE — Progress Notes (Signed)
Agree with the assessment and plan as outlined by Colleen Kennedy-Smith, NP.   Ricco Dershem, DO, FACG [facility]   

## 2020-07-28 LAB — IGG: IgG (Immunoglobin G), Serum: 814 mg/dL (ref 600–1640)

## 2020-07-28 LAB — HEPATITIS A ANTIBODY, TOTAL: Hepatitis A AB,Total: REACTIVE — AB

## 2020-07-28 LAB — MITOCHONDRIAL ANTIBODIES: Mitochondrial M2 Ab, IgG: <=20 U

## 2020-07-28 LAB — ANA: Anti Nuclear Antibody (ANA): NEGATIVE

## 2020-07-28 LAB — HEPATITIS B SURFACE ANTIBODY,QUALITATIVE: Hep B S Ab: NONREACTIVE

## 2020-07-28 LAB — HEPATITIS B CORE ANTIBODY, TOTAL: Hep B Core Total Ab: NONREACTIVE

## 2020-07-28 LAB — CERULOPLASMIN: Ceruloplasmin: 25 mg/dL (ref 18–36)

## 2020-07-28 LAB — ANTI-SMOOTH MUSCLE ANTIBODY, IGG: Actin (Smooth Muscle) Antibody (IGG): <20 U (ref <20)

## 2020-07-28 LAB — ALPHA-1-ANTITRYPSIN: A-1 Antitrypsin, Ser: 115 mg/dL (ref 83–199)

## 2020-08-12 ENCOUNTER — Other Ambulatory Visit: Payer: Self-pay | Admitting: Medical

## 2020-08-18 DIAGNOSIS — F4323 Adjustment disorder with mixed anxiety and depressed mood: Secondary | ICD-10-CM | POA: Diagnosis not present

## 2020-09-15 ENCOUNTER — Other Ambulatory Visit: Payer: Self-pay

## 2020-09-15 ENCOUNTER — Other Ambulatory Visit: Payer: Self-pay | Admitting: Medical

## 2020-09-15 MED ORDER — AMPHETAMINE-DEXTROAMPHET ER 30 MG PO CP24: 60.0000 mg | ORAL_CAPSULE | Freq: Every day | ORAL | 0 refills | Status: DC

## 2020-09-15 NOTE — Telephone Encounter (Signed)
Is this okay to refill? 

## 2020-09-22 DIAGNOSIS — F4323 Adjustment disorder with mixed anxiety and depressed mood: Secondary | ICD-10-CM | POA: Diagnosis not present

## 2020-10-11 ENCOUNTER — Other Ambulatory Visit: Payer: Self-pay | Admitting: Medical

## 2020-10-13 ENCOUNTER — Other Ambulatory Visit: Payer: Self-pay

## 2020-10-13 DIAGNOSIS — F4323 Adjustment disorder with mixed anxiety and depressed mood: Secondary | ICD-10-CM | POA: Diagnosis not present

## 2020-10-14 MED ORDER — AMPHETAMINE-DEXTROAMPHET ER 30 MG PO CP24: 60.0000 mg | ORAL_CAPSULE | Freq: Every day | ORAL | 0 refills | Status: DC

## 2020-11-10 ENCOUNTER — Other Ambulatory Visit: Payer: Self-pay | Admitting: Medical

## 2020-11-10 DIAGNOSIS — F4323 Adjustment disorder with mixed anxiety and depressed mood: Secondary | ICD-10-CM | POA: Diagnosis not present

## 2020-11-14 ENCOUNTER — Telehealth: Payer: Self-pay | Admitting: Medical

## 2020-11-14 NOTE — Telephone Encounter (Signed)
Pt requested [facility] for his [id number] [place] [place] - [address] AT [facility] OF [address] & [address]

## 2020-11-14 NOTE — Telephone Encounter (Signed)
Patient has been notified several times that an appointment is needed

## 2020-11-14 NOTE — Telephone Encounter (Signed)
Decline refill, and send info to pharmacy and patient he needs to schedule physical or med check

## 2020-11-15 ENCOUNTER — Other Ambulatory Visit: Payer: Self-pay | Admitting: Medical

## 2020-11-15 ENCOUNTER — Telehealth: Payer: Self-pay

## 2020-11-15 DIAGNOSIS — Z202 Contact with and (suspected) exposure to infections with a predominantly sexual mode of transmission: Secondary | ICD-10-CM | POA: Diagnosis not present

## 2020-11-15 DIAGNOSIS — Z708 Other sex counseling: Secondary | ICD-10-CM | POA: Diagnosis not present

## 2020-11-15 MED ORDER — ATENOLOL 25 MG PO TABS: ORAL_TABLET | ORAL | 0 refills | Status: DC

## 2020-11-15 NOTE — Telephone Encounter (Signed)
Pt. Called I got him scheduled for a CPE on 02/16/21 he stated he is out of his BP medicine wanted to know if he could get it refilled until he comes in for his apt. Has one pill left and doesn't want to be with out his BP med.

## 2020-12-12 ENCOUNTER — Other Ambulatory Visit: Payer: Self-pay

## 2020-12-12 DIAGNOSIS — F4323 Adjustment disorder with mixed anxiety and depressed mood: Secondary | ICD-10-CM | POA: Diagnosis not present

## 2020-12-12 NOTE — Telephone Encounter (Signed)
Ok to refill 

## 2020-12-13 ENCOUNTER — Other Ambulatory Visit: Payer: Self-pay | Admitting: Medical

## 2020-12-13 MED ORDER — ATENOLOL 25 MG PO TABS: ORAL_TABLET | ORAL | 1 refills | Status: DC

## 2020-12-13 MED ORDER — AMPHETAMINE-DEXTROAMPHET ER 30 MG PO CP24: 60.0000 mg | ORAL_CAPSULE | Freq: Every day | ORAL | 0 refills | Status: DC

## 2021-01-11 ENCOUNTER — Other Ambulatory Visit: Payer: Self-pay

## 2021-01-11 MED ORDER — AMPHETAMINE-DEXTROAMPHET ER 30 MG PO CP24: 60.0000 mg | ORAL_CAPSULE | Freq: Every day | ORAL | 0 refills | Status: DC

## 2021-01-16 ENCOUNTER — Other Ambulatory Visit: Payer: Self-pay | Admitting: Medical

## 2021-01-16 ENCOUNTER — Telehealth: Payer: Self-pay | Admitting: Medical

## 2021-01-16 MED ORDER — AMPHETAMINE-DEXTROAMPHET ER 30 MG PO CP24: 60.0000 mg | ORAL_CAPSULE | Freq: Every day | ORAL | 0 refills | Status: DC

## 2021-01-16 NOTE — Telephone Encounter (Signed)
done

## 2021-01-16 NOTE — Telephone Encounter (Signed)
Pt called and is requesting that his adderrll xr be changed to a different pharmacy [facility] did not have it I called to confirm it and I canceled the rx there. I called [facility] where is wants it changed to and they have it here  Please send it to [facility] [address], [place], [place] [address]  Pt is going out out of town tomorrow

## 2021-02-12 ENCOUNTER — Other Ambulatory Visit: Payer: Self-pay | Admitting: Medical

## 2021-02-16 ENCOUNTER — Ambulatory Visit: Payer: BC Managed Care – PPO | Admitting: Medical

## 2021-02-16 ENCOUNTER — Other Ambulatory Visit: Payer: Self-pay

## 2021-02-16 ENCOUNTER — Telehealth: Payer: Self-pay | Admitting: Medical

## 2021-02-16 ENCOUNTER — Encounter: Payer: Self-pay | Admitting: Medical

## 2021-02-16 VITALS — BP 130/88 | HR 85 | Ht 71.0 in | Wt 221.0 lb

## 2021-02-16 DIAGNOSIS — Z23 Encounter for immunization: Secondary | ICD-10-CM | POA: Diagnosis not present

## 2021-02-16 DIAGNOSIS — Z0000 Encounter for general adult medical examination without abnormal findings: Secondary | ICD-10-CM

## 2021-02-16 DIAGNOSIS — R7989 Other specified abnormal findings of blood chemistry: Secondary | ICD-10-CM | POA: Diagnosis not present

## 2021-02-16 DIAGNOSIS — Z79899 Other long term (current) drug therapy: Secondary | ICD-10-CM

## 2021-02-16 DIAGNOSIS — K760 Fatty (change of) liver, not elsewhere classified: Secondary | ICD-10-CM

## 2021-02-16 DIAGNOSIS — Z6830 Body mass index (BMI) 30.0-30.9, adult: Secondary | ICD-10-CM

## 2021-02-16 DIAGNOSIS — I10 Essential (primary) hypertension: Secondary | ICD-10-CM

## 2021-02-16 MED ORDER — AMPHETAMINE-DEXTROAMPHET ER 30 MG PO CP24: 60.0000 mg | ORAL_CAPSULE | Freq: Every day | ORAL | 0 refills | Status: DC

## 2021-02-16 MED ORDER — ATENOLOL 25 MG PO TABS: 25.0000 mg | ORAL_TABLET | Freq: Every day | ORAL | 3 refills | Status: DC

## 2021-02-16 NOTE — Progress Notes (Addendum)
Subjective:   HPI  Anthony Pena is a 30 y.o. male who presents for Chief Complaint  Patient presents with   Annual Exam    Pt present for physical with fasting labs     Patient Care Team: Ellyssa Zagal, Leward Quan as PCP - General (Family Medicine) Sees dentist Sees eye doctor Carl Best, GI   Concerns: ADD-compliant with medication without complaint  Hypertension-compliant atenolol 25 mg daily without complaint.  He notes home blood pressures run in the 120/80 up to 130/90 but most of the time less than 130/80.  He denies elevated pulses.  No chest pain or difficulty breathing or dyspnea on exertion  He ended up seeing gastroenterology at my request back in November and had an evaluation for elevated liver test.  Exercising some with walking.  Non-smoker.  He still drinks alcohol but has cut back on liquor during the week.  He drinks on average 15 beers per week and probably 4 liquor drinks per week  Reviewed their medical, surgical, family, social, medication, and allergy history and updated chart as appropriate.  Past Medical History:  Diagnosis Date   ADD (attention deficit disorder)    Childhood asthma    Dysgraphia    childhood   Dyslexia    childhood   Elevated cholesterol    Hypertension     Past Surgical History:  Procedure Laterality Date   MOUTH SURGERY      Family History  Problem Relation Age of Onset   Hypertension Mother    Hypertension Father    COPD Father    Heart disease Father        CHF   Diabetes Maternal Grandfather    Cancer Maternal Grandfather        lung,   Prostate cancer Maternal Grandfather    Cancer Maternal Grandmother        breast   Stroke Maternal Grandmother    Heart disease Paternal Grandmother        CHF   Cancer Paternal Grandfather        stomach   Colon cancer Neg Hx    Esophageal cancer Neg Hx      Current Outpatient Medications:    amphetamine-dextroamphetamine (ADDERALL XR) 30 MG 24 hr  capsule, Take 2 capsules (60 mg total) by mouth daily. (Patient not taking: Reported on 02/16/2021), Disp: 60 capsule, Rfl: 0   amphetamine-dextroamphetamine (ADDERALL XR) 30 MG 24 hr capsule, Take 2 capsules (60 mg total) by mouth daily., Disp: 60 capsule, Rfl: 0   atenolol (TENORMIN) 25 MG tablet, Take 1 tablet (25 mg total) by mouth daily., Disp: 90 tablet, Rfl: 3  Allergies  Allergen Reactions   Pollen Extract      Review of Systems Constitutional: -fever, -chills, -sweats, -unexpected weight change, -decreased appetite, -fatigue Allergy: -sneezing, -itching, -congestion Dermatology: -changing moles, --rash, -lumps ENT: -runny nose, -ear pain, -sore throat, -hoarseness, -sinus pain, -teeth pain, - ringing in ears, -hearing loss, -nosebleeds Cardiology: -chest pain, -palpitations, -swelling, -difficulty breathing when lying flat, -waking up short of breath Respiratory: -cough, -shortness of breath, -difficulty breathing with exercise or exertion, -wheezing, -coughing up blood Gastroenterology: -abdominal pain, -nausea, -vomiting, -diarrhea, -constipation, -blood in stool, -changes in bowel movement, -difficulty swallowing or eating Hematology: -bleeding, -bruising  Musculoskeletal: -joint aches, -muscle aches, -joint swelling, -back pain, -neck pain, -cramping, -changes in gait Ophthalmology: denies vision changes, eye redness, itching, discharge Urology: -burning with urination, -difficulty urinating, -blood in urine, -urinary frequency, -urgency, -incontinence Neurology: -headache, -weakness, -  tingling, -numbness, -memory loss, -falls, -dizziness Psychology: -depressed mood, -agitation, -sleep problems Male GU: no testicular mass, pain, no lymph nodes swollen, no swelling, no rash.   Depression screen [facility] 2/9 02/16/2021 08/20/2019 04/25/2018 07/03/2017 07/05/2016  Decreased Interest 0 0 0 0 0  Down, Depressed, Hopeless 0 0 0 0 0  PHQ - 2 Score 0 0 0 0 0  Altered sleeping - - - - 0   Tired, decreased energy - - - - 0  Change in appetite - - - - 0  Feeling bad or failure about yourself  - - - - 0  Trouble concentrating - - - - 0  Moving slowly or fidgety/restless - - - - 0  Suicidal thoughts - - - - 0  PHQ-9 Score - - - - 0        Objective:  BP 130/88   Pulse 85   Ht '5\' 11"'  (1.803 m)   Wt 221 lb (100.2 kg)   SpO2 96%   BMI 30.82 kg/m   General appearance: alert, no distress, WD/WN, Caucasian male Skin: Unremarkable, no worrisome lesions HEENT: normocephalic, conjunctiva/corneas normal, sclerae anicteric, PERRLA, EOMi Neck: supple, no lymphadenopathy, no thyromegaly, no masses, normal ROM, no bruits Chest: non tender, normal shape and expansion Heart: RRR, normal S1, S2, no murmurs Lungs: CTA bilaterally, no wheezes, rhonchi, or rales Abdomen: +bs, soft, non tender, non distended, no masses, no hepatomegaly, no splenomegaly, no bruits Back: non tender, normal ROM, no scoliosis Musculoskeletal: upper extremities non tender, no obvious deformity, normal ROM throughout, lower extremities non tender, no obvious deformity, normal ROM throughout Extremities: no edema, no cyanosis, no clubbing Pulses: 2+ symmetric, upper and lower extremities, normal cap refill Neurological: alert, oriented x 3, CN2-12 intact, strength normal upper extremities and lower extremities, sensation normal throughout, DTRs 2+ throughout, no cerebellar signs, gait normal Psychiatric: normal affect, behavior normal, pleasant  GU: normal male external genitalia,circumcised, nontender, no masses, no hernia, no lymphadenopathy Rectal: Declined  EKG  indication high risk medication, hypertension, rate 92 bpm, PR 148 ms, QRS 84 ms, QTC 413 ms, axis 73 degrees, normal sinus rhythm   Assessment and Plan :   Encounter Diagnoses  Name Primary?   Encounter for health maintenance examination in adult Yes   High risk medication use    Essential hypertension    Elevated LFTs    Fatty liver     Need for hepatitis B vaccination    BMI 30.0-30.9,adult     This visit was a preventative care visit, also known as wellness visit or routine physical.   Topics typically include healthy lifestyle, diet, exercise, preventative care, vaccinations, sick and well care, proper use of emergency dept and after hours care, as well as other concerns.     Recommendations: Continue to return yearly for your annual wellness and preventative care visits.  This gives [place] a chance to discuss healthy lifestyle, exercise, vaccinations, review your chart record, and perform screenings where appropriate.  I recommend you see your eye doctor yearly for routine vision care.  I recommend you see your dentist yearly for routine dental care including hygiene visits twice yearly.   Vaccination recommendations were reviewed Immunization History  Administered Date(s) Administered   DTaP 05/06/1989, 07/09/1989, 09/05/1989, 10/01/1990, 03/07/1994   Hepatitis A 05/16/1999, 12/19/1999   Hepatitis B 06/13/2000, 07/30/2000, 04/21/2001   Hepb-cpg 02/16/2021   HiB (PRP-OMP) 09/05/1989, 12/04/1989, 03/12/1990, 06/26/1990   IPV 05/06/1989, 07/09/1989, 10/01/1990, 03/07/1994   MMR 06/26/1990, 03/07/1994   Meningococcal  Conjugate 09/20/2005   PFIZER(Purple Top)SARS-COV-2 Vaccination 11/27/2019, 12/18/2019, 09/21/2020   Td 05/16/1999, 07/26/2004   Tdap 07/26/2004, 08/20/2019    Counseled on the Hepatitis B virus vaccine.  Vaccine information sheet given.  Hepsilav B vaccine given after consent obtained.  Patient was advised to return in 1 months for Hepsliav B#2.    I recommend yearly flu shot   Screening for cancer: Colon cancer screening: Age 62  Testicular cancer screening You should do a monthly self testicular exam if you are between 106-79 years old  We discussed PSA, prostate exam, and prostate cancer screening risks/benefits.   Age 80  Skin cancer screening: Check your skin regularly for new changes,  growing lesions, or other lesions of concern Come in for evaluation if you have skin lesions of concern.  Lung cancer screening: If you have a greater than 20 pack year history of tobacco use, then you may qualify for lung cancer screening with a chest CT scan.   Please call your insurance company to inquire about coverage for this test.  We currently don't have screenings for other cancers besides breast, cervical, colon, and lung cancers.  If you have a strong family history of cancer or have other cancer screening concerns, please let me know.    Bone health: Get at least 150 minutes of aerobic exercise weekly Get weight bearing exercise at least once weekly Bone density test:  A bone density test is an imaging test that uses a type of X-ray to measure the amount of calcium and other minerals in your bones. The test may be used to diagnose or screen you for a condition that causes weak or thin bones (osteoporosis), predict your risk for a broken bone (fracture), or determine how well your osteoporosis treatment is working. The bone density test is recommended for females 33 and older, or females or males <76 if certain risk factors such as thyroid disease, long term use of steroids such as for asthma or rheumatological issues, vitamin D deficiency, estrogen deficiency, family history of osteoporosis, self or family history of fragility fracture in first degree relative.    Heart health: Get at least 150 minutes of aerobic exercise weekly Limit alcohol It is important to maintain a healthy blood pressure and healthy cholesterol numbers  Heart disease screening: Screening for heart disease includes screening for blood pressure, fasting lipids, glucose/diabetes screening, BMI height to weight ratio, reviewed of smoking status, physical activity, and diet.    Goals include blood pressure 120/80 or less, maintaining a healthy lipid/cholesterol profile, preventing diabetes or keeping diabetes  numbers under good control, not smoking or using tobacco products, exercising most days per week or at least 150 minutes per week of exercise, and eating healthy variety of fruits and vegetables, healthy oils, and avoiding unhealthy food choices like fried food, fast food, high sugar and high cholesterol foods.    Other tests may possibly include EKG test, CT coronary calcium score, echocardiogram, exercise treadmill stress test.     Medical care options: I recommend you continue to seek care here first for routine care.  We try really hard to have available appointments Monday through Friday daytime hours for sick visits, acute visits, and physicals.  Urgent care should be used for after hours and weekends for significant issues that cannot wait till the next day.  The emergency department should be used for significant potentially life-threatening emergencies.  The emergency department is expensive, can often have long wait times for less significant concerns,  so try to utilize primary care, urgent care, or telemedicine when possible to avoid unnecessary trips to the emergency department.  Virtual visits and telemedicine have been introduced since the pandemic started in 2020, and can be convenient ways to receive medical care.  We offer virtual appointments as well to assist you in a variety of options to seek medical care.    Separate significant issues discussed: ADD-continue current medication.  We discussed the risk and benefits of medication  This is a high risk medication that requires surveillance, and drug screening periodically and is a controlled substance.  Please safeguard this medication.  Do not let other people use this medicine or sell this medicine.  Please stick with 1 pharmacy specifically.  This medicine does require follow-up at minimum every 6 months.    Hypertension-goal is to be 130/80 or less.  Monitor your blood pressure preferably 2 to 3 days/week.  If staying over 130/80  we need to increase the dose.  EKG today did not show any worrisome lesions.  Consider echocardiogram for baseline evaluation  History of elevated cholesterol- I am checking an LDL direct lab today.  We may need to do a fasting lipid panel depending on these results.  Your cholesterol was high the last time we checked it  Fatty liver disease, elevated liver test-I strongly recommend healthy low-fat diet, regular exercise and limiting alcohol  I reviewed your gastroenterology consult from November 2021.  Thankfully there was no other underlying liver abnormality such as autoimmune liver disease.   Miciah was seen today for annual exam.  Diagnoses and all orders for this visit:  Encounter for health maintenance examination in adult -     Comprehensive metabolic panel -     LDL Cholesterol, Direct -     EKG 12-Lead -     CBC with Differential/Platelet -     Urine Drug Panel 7  High risk medication use -     Urine Drug Panel 7  Essential hypertension -     EKG 12-Lead  Elevated LFTs  Fatty liver -     Comprehensive metabolic panel -     LDL Cholesterol, Direct  Need for hepatitis B vaccination  BMI 30.0-30.9,adult  Other orders -     atenolol (TENORMIN) 25 MG tablet; Take 1 tablet (25 mg total) by mouth daily. -     amphetamine-dextroamphetamine (ADDERALL XR) 30 MG 24 hr capsule; Take 2 capsules (60 mg total) by mouth daily. -     Heplisav-B (HepB-CPG) Vaccine   Follow-up pending labs, yearly for physical

## 2021-02-16 NOTE — Addendum Note (Signed)
Addended by: Jac Canavan on: 02/16/2021 10:27 AM   Modules accepted: Orders

## 2021-02-16 NOTE — Patient Instructions (Addendum)
This visit was a preventative care visit, also known as wellness visit or routine physical.   Topics typically include healthy lifestyle, diet, exercise, preventative care, vaccinations, sick and well care, proper use of emergency dept and after hours care, as well as other concerns.     Recommendations: Continue to return yearly for your annual wellness and preventative care visits.  This gives [place] a chance to discuss healthy lifestyle, exercise, vaccinations, review your chart record, and perform screenings where appropriate.  I recommend you see your eye doctor yearly for routine vision care.  I recommend you see your dentist yearly for routine dental care including hygiene visits twice yearly.   Vaccination recommendations were reviewed Immunization History  Administered Date(s) Administered  . DTaP 05/06/1989, 07/09/1989, 09/05/1989, 10/01/1990, 03/07/1994  . Hepatitis A 05/16/1999, 12/19/1999  . Hepatitis B 06/13/2000, 07/30/2000, 04/21/2001  . HiB (PRP-OMP) 09/05/1989, 12/04/1989, 03/12/1990, 06/26/1990  . IPV 05/06/1989, 07/09/1989, 10/01/1990, 03/07/1994  . MMR 06/26/1990, 03/07/1994  . Meningococcal Conjugate 09/20/2005  . PFIZER(Purple Top)SARS-COV-2 Vaccination 11/27/2019, 12/18/2019, 09/21/2020  . Td 05/16/1999, 07/26/2004  . Tdap 07/26/2004, 08/20/2019    Counseled on the Hepatitis B virus vaccine.  Vaccine information sheet given.  Hepsilav B vaccine given after consent obtained.  Patient was advised to return in 1 months for Hepsliav B#2.    I recommend yearly flu shot   Screening for cancer: Colon cancer screening: Age 63  Testicular cancer screening You should do a monthly self testicular exam if you are between 26-61 years old  We discussed PSA, prostate exam, and prostate cancer screening risks/benefits.   Age 40  Skin cancer screening: Check your skin regularly for new changes, growing lesions, or other lesions of concern Come in for evaluation if you  have skin lesions of concern.  Lung cancer screening: If you have a greater than 20 pack year history of tobacco use, then you may qualify for lung cancer screening with a chest CT scan.   Please call your insurance company to inquire about coverage for this test.  We currently don't have screenings for other cancers besides breast, cervical, colon, and lung cancers.  If you have a strong family history of cancer or have other cancer screening concerns, please let me know.    Bone health: Get at least 150 minutes of aerobic exercise weekly Get weight bearing exercise at least once weekly Bone density test:   A bone density test is an imaging test that uses a type of X-ray to measure the amount of calcium and other minerals in your bones.  The test may be used to diagnose or screen you for a condition that causes weak or thin bones (osteoporosis), predict your risk for a broken bone (fracture), or determine how well your osteoporosis treatment is working. The bone density test is recommended for females 47 and older, or females or males <03 if certain risk factors such as thyroid disease, long term use of steroids such as for asthma or rheumatological issues, vitamin D deficiency, estrogen deficiency, family history of osteoporosis, self or family history of fragility fracture in first degree relative.    Heart health: Get at least 150 minutes of aerobic exercise weekly Limit alcohol It is important to maintain a healthy blood pressure and healthy cholesterol numbers  Heart disease screening: Screening for heart disease includes screening for blood pressure, fasting lipids, glucose/diabetes screening, BMI height to weight ratio, reviewed of smoking status, physical activity, and diet.    Goals include blood pressure 120/80  or less, maintaining a healthy lipid/cholesterol profile, preventing diabetes or keeping diabetes numbers under good control, not smoking or using tobacco products,  exercising most days per week or at least 150 minutes per week of exercise, and eating healthy variety of fruits and vegetables, healthy oils, and avoiding unhealthy food choices like fried food, fast food, high sugar and high cholesterol foods.    Other tests may possibly include EKG test, CT coronary calcium score, echocardiogram, exercise treadmill stress test.     Medical care options: I recommend you continue to seek care here first for routine care.  We try really hard to have available appointments Monday through Friday daytime hours for sick visits, acute visits, and physicals.  Urgent care should be used for after hours and weekends for significant issues that cannot wait till the next day.  The emergency department should be used for significant potentially life-threatening emergencies.  The emergency department is expensive, can often have long wait times for less significant concerns, so try to utilize primary care, urgent care, or telemedicine when possible to avoid unnecessary trips to the emergency department.  Virtual visits and telemedicine have been introduced since the pandemic started in 2020, and can be convenient ways to receive medical care.  We offer virtual appointments as well to assist you in a variety of options to seek medical care.    Separate significant issues discussed: ADD-continue current medication.  We discussed the risk and benefits of medication.  This is a high risk medication that requires surveillance, and drug screening periodically and is a controlled substance.  Please safeguard this medication.  Do not let other people use this medicine or sell this medicine.  Please stick with 1 pharmacy specifically.  This medicine does require follow-up at minimum every 6 months.  Hypertension-goal is to be 130/80 or less.  Monitor your blood pressure preferably 2 to 3 days/week.  If staying over 130/80 we need to increase the dose.  EKG today did not show any worrisome  lesions.  Consider echocardiogram for baseline evaluation  History of elevated cholesterol- I am checking an LDL direct lab today.  We may need to do a fasting lipid panel depending on these results.  Your cholesterol was high the last time we checked it  Fatty liver disease, elevated liver test-I strongly recommend healthy low-fat diet, regular exercise and limiting alcohol  I reviewed your gastroenterology consult from November 2021.  Thankfully there was no other underlying liver abnormality such as autoimmune liver disease.

## 2021-02-16 NOTE — Addendum Note (Signed)
Addended by: Victorio Palm on: 02/16/2021 10:46 AM   Modules accepted: Orders

## 2021-02-16 NOTE — Telephone Encounter (Signed)
Pt needs refill of adderral [facility] to [id number] [place], [place] - [address]

## 2021-02-16 NOTE — Telephone Encounter (Signed)
Med was sent at time of visit to the listed pharmacy [facility]

## 2021-02-17 LAB — COMPREHENSIVE METABOLIC PANEL
ALT: 71 IU/L — ABNORMAL HIGH (ref 0–44)
AST: 38 IU/L (ref 0–40)
Albumin/Globulin Ratio: 1.9 (ref 1.2–2.2)
Albumin: 5 g/dL (ref 4.0–5.0)
Alkaline Phosphatase: 80 IU/L (ref 44–121)
BUN/Creatinine Ratio: 14 (ref 9–20)
BUN: 13 mg/dL (ref 6–20)
Bilirubin Total: 0.5 mg/dL (ref 0.0–1.2)
CO2: 22 mmol/L (ref 20–29)
Calcium: 9.7 mg/dL (ref 8.7–10.2)
Chloride: 97 mmol/L (ref 96–106)
Creatinine, Ser: 0.9 mg/dL (ref 0.76–1.27)
Globulin, Total: 2.6 g/dL (ref 1.5–4.5)
Glucose: 91 mg/dL (ref 65–99)
Potassium: 4.4 mmol/L (ref 3.5–5.2)
Sodium: 136 mmol/L (ref 134–144)
Total Protein: 7.6 g/dL (ref 6.0–8.5)
eGFR: 117 mL/min/{1.73_m2} (ref >59)

## 2021-02-17 LAB — CBC WITH DIFFERENTIAL/PLATELET
Basophils Absolute: 0.1 10*3/uL (ref 0.0–0.2)
Basos: 1 %
EOS (ABSOLUTE): 0.1 10*3/uL (ref 0.0–0.4)
Eos: 2 %
Hematocrit: 50.2 % (ref 37.5–51.0)
Hemoglobin: 16.9 g/dL (ref 13.0–17.7)
Immature Grans (Abs): 0 10*3/uL (ref 0.0–0.1)
Immature Granulocytes: 1 %
Lymphocytes Absolute: 1.1 10*3/uL (ref 0.7–3.1)
Lymphs: 19 %
MCH: 32.6 pg (ref 26.6–33.0)
MCHC: 33.7 g/dL (ref 31.5–35.7)
MCV: 97 fL (ref 79–97)
Monocytes Absolute: 0.7 10*3/uL (ref 0.1–0.9)
Monocytes: 12 %
Neutrophils Absolute: 3.7 10*3/uL (ref 1.4–7.0)
Neutrophils: 65 %
Platelets: 276 10*3/uL (ref 150–450)
RBC: 5.19 x10E6/uL (ref 4.14–5.80)
RDW: 11.5 % — ABNORMAL LOW (ref 11.6–15.4)
WBC: 5.6 10*3/uL (ref 3.4–10.8)

## 2021-02-17 LAB — LDL CHOLESTEROL, DIRECT: LDL Direct: 142 mg/dL — ABNORMAL HIGH (ref 0–99)

## 2021-02-24 LAB — URINE DRUG PANEL 7
Amphetamines, Urine: POSITIVE — AB
Barbiturate Quant, Ur: NEGATIVE ng/mL
Benzodiazepine Quant, Ur: NEGATIVE ng/mL
Cannabinoid Quant, Ur: NEGATIVE ng/mL
Cocaine (Metab.): NEGATIVE ng/mL
Opiate Quant, Ur: NEGATIVE ng/mL
PCP Quant, Ur: NEGATIVE ng/mL

## 2021-03-14 ENCOUNTER — Other Ambulatory Visit: Payer: Self-pay | Admitting: Medical

## 2021-03-15 ENCOUNTER — Other Ambulatory Visit: Payer: Self-pay

## 2021-03-15 NOTE — Telephone Encounter (Signed)
[facility] Please advise KH 

## 2021-03-16 MED ORDER — AMPHETAMINE-DEXTROAMPHET ER 30 MG PO CP24: 60.0000 mg | ORAL_CAPSULE | Freq: Every day | ORAL | 0 refills | Status: DC

## 2021-04-19 ENCOUNTER — Other Ambulatory Visit: Payer: Self-pay | Admitting: Family Medicine

## 2021-04-19 MED ORDER — AMPHETAMINE-DEXTROAMPHET ER 30 MG PO CP24: 60.0000 mg | ORAL_CAPSULE | Freq: Every day | ORAL | 0 refills | Status: DC

## 2021-04-19 NOTE — Telephone Encounter (Signed)
[facility] Please advise KH 

## 2021-05-17 ENCOUNTER — Other Ambulatory Visit: Payer: Self-pay

## 2021-05-17 MED ORDER — AMPHETAMINE-DEXTROAMPHET ER 30 MG PO CP24: 60.0000 mg | ORAL_CAPSULE | Freq: Every day | ORAL | 0 refills | Status: DC

## 2021-06-19 ENCOUNTER — Other Ambulatory Visit: Payer: Self-pay | Admitting: Medical

## 2021-06-19 ENCOUNTER — Telehealth: Payer: Self-pay

## 2021-06-19 ENCOUNTER — Other Ambulatory Visit: Payer: Self-pay

## 2021-06-19 MED ORDER — AMPHETAMINE-DEXTROAMPHET ER 30 MG PO CP24: 60.0000 mg | ORAL_CAPSULE | Freq: Every day | ORAL | 0 refills | Status: DC

## 2021-06-19 NOTE — Telephone Encounter (Signed)
Pt. Called stating that you [facility] in [facility] but they are out of it there and do not know when they are going to get it back. He wanted to know if you could send it in to [facility] on [address].

## 2021-07-17 ENCOUNTER — Other Ambulatory Visit: Payer: Self-pay

## 2021-07-17 MED ORDER — AMPHETAMINE-DEXTROAMPHET ER 30 MG PO CP24: 60.0000 mg | ORAL_CAPSULE | Freq: Every day | ORAL | 0 refills | Status: DC

## 2021-08-15 ENCOUNTER — Other Ambulatory Visit: Payer: Self-pay | Admitting: Medical

## 2021-08-15 MED ORDER — AMPHETAMINE-DEXTROAMPHET ER 30 MG PO CP24
30.0000 mg | ORAL_CAPSULE | Freq: Two times a day (BID) | ORAL | 0 refills | Status: DC
Start: 2021-08-15 — End: 2021-09-15

## 2021-09-15 ENCOUNTER — Other Ambulatory Visit: Payer: Self-pay | Admitting: Family Medicine

## 2021-09-15 MED ORDER — AMPHETAMINE-DEXTROAMPHET ER 30 MG PO CP24: 30.0000 mg | ORAL_CAPSULE | Freq: Two times a day (BID) | ORAL | 0 refills | Status: DC

## 2021-09-19 ENCOUNTER — Other Ambulatory Visit: Payer: Self-pay | Admitting: Medical

## 2021-09-19 ENCOUNTER — Telehealth: Payer: Self-pay

## 2021-09-19 MED ORDER — AMPHETAMINE-DEXTROAMPHET ER 30 MG PO CP24: 30.0000 mg | ORAL_CAPSULE | Freq: Two times a day (BID) | ORAL | 0 refills | Status: DC

## 2021-09-19 NOTE — Telephone Encounter (Signed)
Pt states [facility] is out of Adderall and they don't know when will be back in, it's on backorder.  [facility] in [facility], please [facility] to [facility]

## 2021-09-25 ENCOUNTER — Other Ambulatory Visit: Payer: Self-pay | Admitting: Medical

## 2021-10-20 ENCOUNTER — Telehealth: Payer: Self-pay | Admitting: Medical

## 2021-10-20 ENCOUNTER — Other Ambulatory Visit: Payer: Self-pay | Admitting: Medical

## 2021-10-20 MED ORDER — AMPHETAMINE-DEXTROAMPHET ER 30 MG PO CP24: 30.0000 mg | ORAL_CAPSULE | Freq: Two times a day (BID) | ORAL | 0 refills | Status: DC

## 2021-10-20 NOTE — Telephone Encounter (Signed)
Called pt. LM to call back to schedule med check apt.

## 2021-10-20 NOTE — Telephone Encounter (Signed)
Schedule med check now before the next 30 days.  I refilled his Adderall but he needs a 27-month recheck/med check visit

## 2021-10-23 ENCOUNTER — Telehealth: Payer: Self-pay | Admitting: Medical

## 2021-10-23 NOTE — Telephone Encounter (Signed)
Pt called and said the [facility] [address] [facility] not [facility] any [facility], he called the [facility] on [address] and they have it in stock. He wants to see if he can get it switched there instead.

## 2021-10-24 ENCOUNTER — Other Ambulatory Visit: Payer: Self-pay | Admitting: Medical

## 2021-10-24 MED ORDER — AMPHETAMINE-DEXTROAMPHET ER 30 MG PO CP24: 30.0000 mg | ORAL_CAPSULE | Freq: Two times a day (BID) | ORAL | 0 refills | Status: DC

## 2021-10-24 NOTE — Telephone Encounter (Signed)
Spoke to the pharmacy and they do not have it in stock so pt didn't pick it up. They canceled the prescription since its out of stock

## 2021-11-23 ENCOUNTER — Other Ambulatory Visit: Payer: Self-pay | Admitting: Medical

## 2021-11-23 ENCOUNTER — Telehealth: Payer: Self-pay | Admitting: Medical

## 2021-11-23 ENCOUNTER — Telehealth: Payer: Self-pay

## 2021-11-23 MED ORDER — AMPHETAMINE-DEXTROAMPHET ER 20 MG PO CP24: 20.0000 mg | ORAL_CAPSULE | Freq: Every day | ORAL | 0 refills | Status: DC

## 2021-11-23 MED ORDER — AMPHETAMINE-DEXTROAMPHET ER 30 MG PO CP24: 30.0000 mg | ORAL_CAPSULE | Freq: Two times a day (BID) | ORAL | 0 refills | Status: DC

## 2021-11-23 NOTE — Telephone Encounter (Signed)
Pt. Called stating that you called in his Adderall 20 mg XR but the instructions and quantity was incorrect. He said its supposed be 3 capsules once daily and # 90 to [facility] on [address].  ?

## 2021-11-23 NOTE — Telephone Encounter (Signed)
Pt called and states that his pharmacy does not have any adderall. He states that [facility] on [address] has 20 mg in stock. Please send to that pharmacy.  ?

## 2021-11-24 NOTE — Telephone Encounter (Signed)
Called pt - LM.

## 2021-11-24 NOTE — Telephone Encounter (Signed)
Pt. Said he did not agree with that but would make it work.  ?

## 2022-01-08 ENCOUNTER — Telehealth: Payer: Self-pay

## 2022-01-08 ENCOUNTER — Other Ambulatory Visit: Payer: Self-pay | Admitting: Medical

## 2022-01-08 MED ORDER — AMPHETAMINE-DEXTROAMPHET ER 30 MG PO CP24: 30.0000 mg | ORAL_CAPSULE | Freq: Two times a day (BID) | ORAL | 0 refills | Status: DC

## 2022-01-08 NOTE — Telephone Encounter (Signed)
Pt is requesting refill on Adderall 30mg  sent to the [facility] on [address]. ?

## 2022-02-06 ENCOUNTER — Telehealth: Payer: Self-pay | Admitting: Medical

## 2022-02-06 ENCOUNTER — Other Ambulatory Visit: Payer: Self-pay | Admitting: Medical

## 2022-02-06 MED ORDER — AMPHETAMINE-DEXTROAMPHET ER 30 MG PO CP24: 30.0000 mg | ORAL_CAPSULE | Freq: Two times a day (BID) | ORAL | 0 refills | Status: DC

## 2022-02-06 NOTE — Telephone Encounter (Signed)
Pt called for refills of Adderall XR. Please send to [facility] on [address]. Pt did call pharmacy and confirmed as of now they do have it in stock.

## 2022-02-22 ENCOUNTER — Encounter: Payer: BC Managed Care – PPO | Admitting: Medical

## 2022-02-27 ENCOUNTER — Other Ambulatory Visit: Payer: Self-pay | Admitting: Medical

## 2022-02-27 NOTE — Telephone Encounter (Signed)
Has upcoming appointment soon 

## 2022-03-07 ENCOUNTER — Telehealth: Payer: Self-pay | Admitting: Medical

## 2022-03-07 ENCOUNTER — Other Ambulatory Visit: Payer: Self-pay | Admitting: Medical

## 2022-03-07 MED ORDER — AMPHETAMINE-DEXTROAMPHET ER 30 MG PO CP24: 30.0000 mg | ORAL_CAPSULE | Freq: Two times a day (BID) | ORAL | 0 refills | Status: DC

## 2022-03-07 NOTE — Telephone Encounter (Signed)
Pt called and is requesting a refill on his adderall please [facility] the  [facility] [address] [place], [place] [address]

## 2022-03-08 ENCOUNTER — Other Ambulatory Visit: Payer: Self-pay | Admitting: Medical

## 2022-03-08 ENCOUNTER — Telehealth: Payer: Self-pay

## 2022-03-08 MED ORDER — AMPHETAMINE-DEXTROAMPHET ER 30 MG PO CP24: 30.0000 mg | ORAL_CAPSULE | Freq: Two times a day (BID) | ORAL | 0 refills | Status: DC

## 2022-04-04 ENCOUNTER — Encounter: Payer: Self-pay | Admitting: Medical

## 2022-05-08 ENCOUNTER — Telehealth: Payer: Self-pay | Admitting: Medical

## 2022-05-08 NOTE — Telephone Encounter (Signed)
Pt calling in requesting refill on his adderall to the same pharmacy.

## 2022-05-09 ENCOUNTER — Other Ambulatory Visit: Payer: Self-pay | Admitting: Medical

## 2022-05-09 MED ORDER — AMPHETAMINE-DEXTROAMPHET ER 30 MG PO CP24: 30.0000 mg | ORAL_CAPSULE | Freq: Two times a day (BID) | ORAL | 0 refills | Status: DC

## 2022-05-17 ENCOUNTER — Ambulatory Visit (INDEPENDENT_AMBULATORY_CARE_PROVIDER_SITE_OTHER): Payer: Self-pay | Admitting: Medical

## 2022-05-17 VITALS — BP 136/84 | HR 84 | Temp 98.6°F | Ht 71.0 in | Wt 221.4 lb

## 2022-05-17 DIAGNOSIS — F988 Other specified behavioral and emotional disorders with onset usually occurring in childhood and adolescence: Secondary | ICD-10-CM

## 2022-05-17 DIAGNOSIS — Z79899 Other long term (current) drug therapy: Secondary | ICD-10-CM

## 2022-05-17 DIAGNOSIS — Z6830 Body mass index (BMI) 30.0-30.9, adult: Secondary | ICD-10-CM

## 2022-05-17 DIAGNOSIS — R7989 Other specified abnormal findings of blood chemistry: Secondary | ICD-10-CM

## 2022-05-17 DIAGNOSIS — K760 Fatty (change of) liver, not elsewhere classified: Secondary | ICD-10-CM

## 2022-05-17 DIAGNOSIS — I10 Essential (primary) hypertension: Secondary | ICD-10-CM

## 2022-05-17 MED ORDER — VALSARTAN 40 MG PO TABS: 40.0000 mg | ORAL_TABLET | Freq: Every day | ORAL | 3 refills | Status: DC

## 2022-05-17 NOTE — Patient Instructions (Signed)
Recommendations: Stop Atenolol by taking 1/2 tablet daily for 5 days then discontinue Begin Valsartan 40mg , 1/2 tablet daily for 5 days, then increase to a whole tablet daily Monitor BP and pulse 3-5 days per week for the next month and recheck or my chart email readings  The goal is to keep BP under 130/80, and pulse less than 90 at rest  The second is to see improvements in exercise tolerance and see if the ED issues resolves

## 2022-05-17 NOTE — Progress Notes (Signed)
Subjective:  Anthony Pena is a 31 y.o. male who presents for Chief Complaint  Patient presents with   med check    Fasting      Here for med check.  Was suppose to be here for well visit but has no insurance.    ADD - has been adderall for years and has done well with this.  Has been on several other medications prior, was on Ritalin originally.  Has been on concert, ritalin, adderall, others.  Had issues with others or not as good as results as with adderall . Would like to continue adderall.  Tried Strattera in the past and it didn't seem to work as well.  Taking Atenolol and has seen improvements in pulse and BP since being on this.   However, if doing strenuous activity, gets a little winded more than he would expected.  Also having some ED issues on this medication Atenolol.    Hx/o elevated LFTs, fatty liver, and prior heavier alcohol use.   Has cut way back.   Currently hardly every drinks liquor.  Drinks 1-3 beers on an occasional.  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     No other aggravating or relieving factors.    No other c/o.  Past Medical History:  Diagnosis Date   ADD (attention deficit disorder)    Childhood asthma    Dysgraphia    childhood   Dyslexia    childhood   Elevated cholesterol    Hypertension    Current Outpatient Medications on File Prior to Visit  Medication Sig Dispense Refill   amphetamine-dextroamphetamine (ADDERALL XR) 30 MG 24 hr capsule Take 1 capsule (30 mg total) by mouth 2 (two) times daily. 60 capsule 0   atenolol (TENORMIN) 25 MG tablet TAKE 1 TABLET (25 MG TOTAL) BY MOUTH DAILY. 90 tablet 0   No current facility-administered medications on file prior to visit.   The following portions of the patient's history were reviewed and updated as appropriate: allergies, current medications, past family history, past medical history, past social history,  past surgical history and problem list.  ROS Otherwise as in subjective above    Objective: BP 136/84   Pulse 84   Temp 98.6 F (37 C)   Ht 5\' 11"  (1.803 m)   Wt 221 lb 6.4 oz (100.4 kg)   SpO2 98%   BMI 30.88 kg/m   BP Readings from Last 3 Encounters:  05/17/22 136/84  02/16/21 130/88  07/21/20 126/82   Wt Readings from Last 3 Encounters:  05/17/22 221 lb 6.4 oz (100.4 kg)  02/16/21 221 lb (100.2 kg)  07/21/20 220 lb (99.8 kg)    General appearance: alert, no distress, well developed, well nourished Neck: supple, no lymphadenopathy, no thyromegaly, no masses Heart: RRR, normal S1, S2, no murmurs Lungs: CTA bilaterally, no wheezes, rhonchi, or rales Abdomen: +bs, soft, non tender, non distended, no masses, no hepatomegaly, no splenomegaly Pulses: 2+ radial pulses, 2+ pedal pulses, normal cap refill Ext: no edema Psch: pleasant, good eye contact, answers questions approximately   Assessment: Encounter Diagnoses  Name Primary?   Elevated LFTs Yes   BMI 30.0-30.9,adult    Attention deficit disorder, unspecified hyperactivity presence    Essential hypertension    Fatty liver    High risk medication use      Plan: We discussed his medications, risk and benefits of each  medication.  We discussed prior medications for Adderall.  He wants to continue Adderall versus trying something else.  He has been on several medicines before.  He has never been on Vyvanse.  He also continue Adderall.  Hypertension-we will stop atenolol and do a trial of valsartan 10 mg daily.  Discussed risk and benefits with the medication.  He will wean off 1 while trending up on the other as discussed.  He is concerned about ED and lack of exercise tolerance on the beta-blocker.  He will likely see improvement but we will need to monitor the pulse and blood pressure.  He has history of tachycardia on stimulants.  He has a smart watch to keep monitoring this  History of fatty liver disease.  I  reviewed his 2021 gastroenterology notes and labs.  Recheck labs as below.  Anthony Pena was seen today for med check.  Diagnoses and all orders for this visit:  Elevated LFTs -     Comprehensive metabolic panel  BMI 30.0-30.9,adult  Attention deficit disorder, unspecified hyperactivity presence  Essential hypertension -     Lipid panel  Fatty liver -     Lipid panel  High risk medication use -     Comprehensive metabolic panel  Other orders -     valsartan (DIOVAN) 40 MG tablet; Take 1 tablet (40 mg total) by mouth daily.    Follow up: pending labs

## 2022-05-18 ENCOUNTER — Other Ambulatory Visit: Payer: Self-pay | Admitting: Medical

## 2022-05-18 LAB — COMPREHENSIVE METABOLIC PANEL
ALT: 108 IU/L — ABNORMAL HIGH (ref 0–44)
AST: 70 IU/L — ABNORMAL HIGH (ref 0–40)
Albumin/Globulin Ratio: 1.9 (ref 1.2–2.2)
Albumin: 5 g/dL (ref 4.1–5.1)
Alkaline Phosphatase: 73 IU/L (ref 44–121)
BUN/Creatinine Ratio: 14 (ref 9–20)
BUN: 12 mg/dL (ref 6–20)
Bilirubin Total: 0.9 mg/dL (ref 0.0–1.2)
CO2: 19 mmol/L — ABNORMAL LOW (ref 20–29)
Calcium: 9.8 mg/dL (ref 8.7–10.2)
Chloride: 99 mmol/L (ref 96–106)
Creatinine, Ser: 0.88 mg/dL (ref 0.76–1.27)
Globulin, Total: 2.6 g/dL (ref 1.5–4.5)
Glucose: 86 mg/dL (ref 70–99)
Potassium: 4.2 mmol/L (ref 3.5–5.2)
Sodium: 137 mmol/L (ref 134–144)
Total Protein: 7.6 g/dL (ref 6.0–8.5)
eGFR: 116 mL/min/{1.73_m2} (ref >59)

## 2022-05-18 LAB — LIPID PANEL
Chol/HDL Ratio: 5.2 ratio — ABNORMAL HIGH (ref 0.0–5.0)
Cholesterol, Total: 238 mg/dL — ABNORMAL HIGH (ref 100–199)
HDL: 46 mg/dL (ref >39)
LDL Chol Calc (NIH): 171 mg/dL — ABNORMAL HIGH (ref 0–99)
Triglycerides: 116 mg/dL (ref 0–149)
VLDL Cholesterol Cal: 21 mg/dL (ref 5–40)

## 2022-05-18 MED ORDER — ROSUVASTATIN CALCIUM 5 MG PO TABS: 5.0000 mg | ORAL_TABLET | ORAL | 1 refills | Status: DC

## 2022-06-13 ENCOUNTER — Ambulatory Visit: Payer: Self-pay | Admitting: Nurse Practitioner

## 2022-06-26 ENCOUNTER — Encounter: Payer: Self-pay | Admitting: Internal Medicine

## 2022-07-06 ENCOUNTER — Telehealth: Payer: Self-pay | Admitting: Medical

## 2022-07-06 ENCOUNTER — Other Ambulatory Visit: Payer: Self-pay | Admitting: Medical

## 2022-07-06 MED ORDER — AMPHETAMINE-DEXTROAMPHET ER 30 MG PO CP24: 30.0000 mg | ORAL_CAPSULE | Freq: Two times a day (BID) | ORAL | 0 refills | Status: DC

## 2022-07-06 NOTE — Telephone Encounter (Signed)
Pt called in for a refill on adderall, says the pharmacy has just the amount he needs at this time and he wants to pick it up before they run out. Using [place], Anthony Pena [place]

## 2022-07-17 ENCOUNTER — Ambulatory Visit: Payer: Self-pay | Admitting: Nurse Practitioner

## 2022-08-06 ENCOUNTER — Telehealth: Payer: Self-pay | Admitting: Medical

## 2022-08-06 ENCOUNTER — Other Ambulatory Visit: Payer: Self-pay | Admitting: Medical

## 2022-08-06 MED ORDER — AMPHETAMINE-DEXTROAMPHET ER 30 MG PO CP24: 30.0000 mg | ORAL_CAPSULE | Freq: Two times a day (BID) | ORAL | 0 refills | Status: DC

## 2022-08-06 NOTE — Telephone Encounter (Signed)
Pt needs a refill on aderrall sent to  [id number] - [place], [place] - [address] AT [facility] [address]  Says they have just enough for what he needs.

## 2022-08-22 NOTE — Progress Notes (Unsigned)
     08/22/2022 Anthony Pena [id number] 04-22-1991   Chief Complaint:  History of Present Illness: Anthony Pena is a 31 year old male with a past medical history of hypertension, hypercholesterolemia, ADD, fatty liver and elevated LFTs.  I last saw him in office on 07/20/2020 for follow up regarding elevated LFTs.     Latest Ref Rng & Units 02/16/2021   10:15 AM 07/21/2020    9:16 AM 08/20/2019   10:05 AM  CBC  WBC 3.4 - 10.8 x10E3/uL 5.6  5.1  5.1   Hemoglobin 13.0 - 17.7 g/dL 27.7  41.2  87.8   Hematocrit 37.5 - 51.0 % 50.2  47.4  47.7   Platelets 150 - 450 x10E3/uL 276  260.0  279        Latest Ref Rng & Units 05/17/2022    4:04 PM 02/16/2021   10:15 AM 07/21/2020    9:16 AM  CMP  Glucose 70 - 99 mg/dL 86  91    BUN 6 - 20 mg/dL 12  13    Creatinine 6.76 - 1.27 mg/dL 7.20  9.47    Sodium 096 - 144 mmol/L 137  136    Potassium 3.5 - 5.2 mmol/L 4.2  4.4    Chloride 96 - 106 mmol/L 99  97    CO2 20 - 29 mmol/L 19  22    Calcium 8.7 - 10.2 mg/dL 9.8  9.7    Total Protein 6.0 - 8.5 g/dL 7.6  7.6  7.1   Total Bilirubin 0.0 - 1.2 mg/dL 0.9  0.5  1.2   Alkaline Phos 44 - 121 IU/L 73  80  55   AST 0 - 40 IU/L 70  38  38   ALT 0 - 44 IU/L 108  71  86     04/25/2018: Hepatitis B surface antigen negative.  Hepatitis C antibody 0.1.   RUQ sonogram 10/29/2017: 1. Hepatic steatosis. 2. No evidence for acute cholecystitis.   Current Medications, Allergies, Past Medical History, Past Surgical History, Family History and Social History were reviewed in [employer] record.   Review of Systems:   Constitutional: Negative for fever, sweats, chills or weight loss.  Respiratory: Negative for shortness of breath.   Cardiovascular: Negative for chest pain, palpitations and leg swelling.  Gastrointestinal: See HPI.  Musculoskeletal: Negative for back pain or muscle aches.  Neurological: Negative for dizziness, headaches or paresthesias.    Physical Exam: There  were no vitals taken for this visit. General: Well developed, w   ***male in no acute distress. Head: Normocephalic and atraumatic. Eyes: No scleral icterus. Conjunctiva pink . Ears: Normal auditory acuity. Mouth: Dentition intact. No ulcers or lesions.  Lungs: Clear throughout to auscultation. Heart: Regular rate and rhythm, no murmur. Abdomen: Soft, nontender and nondistended. No masses or hepatomegaly. Normal bowel sounds x 4 quadrants.  Rectal: *** Musculoskeletal: Symmetrical with no gross deformities. Extremities: No edema. Neurological: Alert oriented x 4. No focal deficits.  Psychological: Alert and cooperative. Normal mood and affect  Assessment and Recommendations:  56) 31 year old male with a history of elevated hepatic steatosis and elevated LFTs

## 2022-08-23 ENCOUNTER — Ambulatory Visit: Payer: Self-pay | Admitting: Nurse Practitioner

## 2022-09-04 ENCOUNTER — Telehealth: Payer: Self-pay | Admitting: Medical

## 2022-09-04 ENCOUNTER — Other Ambulatory Visit: Payer: Self-pay | Admitting: Medical

## 2022-09-04 MED ORDER — AMPHETAMINE-DEXTROAMPHET ER 30 MG PO CP24: 30.0000 mg | ORAL_CAPSULE | Freq: Two times a day (BID) | ORAL | 0 refills | Status: DC

## 2022-09-04 NOTE — Telephone Encounter (Signed)
Pt requesting refill on adderall to  [id number] - , [place] - [address] AT [facility] OF [address]

## 2022-10-02 DIAGNOSIS — F4323 Adjustment disorder with mixed anxiety and depressed mood: Secondary | ICD-10-CM | POA: Diagnosis not present

## 2022-10-05 ENCOUNTER — Telehealth: Payer: Self-pay | Admitting: Medical

## 2022-10-05 NOTE — Telephone Encounter (Signed)
[facility]  (also confirmed pt's address)

## 2022-10-08 ENCOUNTER — Other Ambulatory Visit: Payer: Self-pay | Admitting: Medical

## 2022-10-08 MED ORDER — AMPHETAMINE-DEXTROAMPHET ER 30 MG PO CP24: 30.0000 mg | ORAL_CAPSULE | Freq: Two times a day (BID) | ORAL | 0 refills | Status: DC

## 2022-10-08 NOTE — Telephone Encounter (Signed)
Pt called and made a appt for feb 23 for a cpe Please send [facility] the  [id number] [place], [place] - [place]. AT [place]

## 2022-10-09 NOTE — Telephone Encounter (Signed)
Left message for pt to call back  °

## 2022-10-10 NOTE — Telephone Encounter (Signed)
Left message for pt to call back

## 2022-10-11 NOTE — Telephone Encounter (Signed)
Left message for pt to call back

## 2022-10-12 NOTE — Telephone Encounter (Signed)
See Shane's note about getting records, thanks

## 2022-10-12 NOTE — Telephone Encounter (Signed)
Left message for pt to call back: Unable to reach pt by phone after multiple attempts: My Chart message sent to pt:

## 2022-10-23 DIAGNOSIS — F4323 Adjustment disorder with mixed anxiety and depressed mood: Secondary | ICD-10-CM | POA: Diagnosis not present

## 2022-11-09 ENCOUNTER — Encounter: Payer: Self-pay | Admitting: Medical

## 2022-11-09 ENCOUNTER — Ambulatory Visit (INDEPENDENT_AMBULATORY_CARE_PROVIDER_SITE_OTHER): Payer: BC Managed Care – PPO | Admitting: Medical

## 2022-11-09 VITALS — BP 120/70 | HR 87 | Ht 71.0 in | Wt 223.0 lb

## 2022-11-09 DIAGNOSIS — Z13220 Encounter for screening for lipoid disorders: Secondary | ICD-10-CM | POA: Diagnosis not present

## 2022-11-09 DIAGNOSIS — F988 Other specified behavioral and emotional disorders with onset usually occurring in childhood and adolescence: Secondary | ICD-10-CM

## 2022-11-09 DIAGNOSIS — K760 Fatty (change of) liver, not elsewhere classified: Secondary | ICD-10-CM | POA: Diagnosis not present

## 2022-11-09 DIAGNOSIS — Z79899 Other long term (current) drug therapy: Secondary | ICD-10-CM

## 2022-11-09 DIAGNOSIS — Z0000 Encounter for general adult medical examination without abnormal findings: Secondary | ICD-10-CM | POA: Diagnosis not present

## 2022-11-09 DIAGNOSIS — I10 Essential (primary) hypertension: Secondary | ICD-10-CM

## 2022-11-09 LAB — POCT URINALYSIS DIP (PROADVANTAGE DEVICE)
Bilirubin, UA: NEGATIVE
Blood, UA: NEGATIVE
Glucose, UA: NEGATIVE mg/dL
Ketones, POC UA: NEGATIVE mg/dL
Leukocytes, UA: NEGATIVE
Nitrite, UA: NEGATIVE
Protein Ur, POC: NEGATIVE mg/dL
Specific Gravity, Urine: 1.01
Urobilinogen, Ur: NEGATIVE
pH, UA: 6 (ref 5.0–8.0)

## 2022-11-09 MED ORDER — AMPHETAMINE-DEXTROAMPHET ER 30 MG PO CP24: 30.0000 mg | ORAL_CAPSULE | Freq: Two times a day (BID) | ORAL | 0 refills | Status: DC

## 2022-11-09 MED ORDER — AMPHETAMINE-DEXTROAMPHET ER 30 MG PO CP24: 30.0000 mg | ORAL_CAPSULE | Freq: Every day | ORAL | 0 refills | Status: DC

## 2022-11-09 NOTE — Patient Instructions (Signed)
Separate significant issues discussed: ADD - doing fine on current regimen.   He is aware of risks and benefits of medication.  Hypertensin - controlled on current medication  Hepatic steatosis - updated labs today    General Recommendations: Continue to return yearly for your annual wellness and preventative care visits.  This gives [place] a chance to discuss healthy lifestyle, exercise, vaccinations, review your chart record, and perform screenings where appropriate.  I recommend you see your eye doctor yearly for routine vision care.  I recommend you see your dentist yearly for routine dental care including hygiene visits twice yearly.   Vaccination  Immunization History  Administered Date(s) Administered   DTaP 05/06/1989, 07/09/1989, 09/05/1989, 10/01/1990, 03/07/1994   HIB (PRP-OMP) 09/05/1989, 12/04/1989, 03/12/1990, 06/26/1990   Hepatitis A 05/16/1999, 12/19/1999   Hepatitis B 06/13/2000, 07/30/2000, 04/21/2001   Hepb-cpg 02/16/2021   IPV 05/06/1989, 07/09/1989, 10/01/1990, 03/07/1994   MMR 06/26/1990, 03/07/1994   Meningococcal Conjugate 09/20/2005   PFIZER(Purple Top)SARS-COV-2 Vaccination 11/27/2019, 12/18/2019, 09/21/2020   Td 05/16/1999, 07/26/2004   Tdap 07/26/2004, 08/20/2019    Screening for cancer: Colon cancer screening: Age 89   Testicular cancer screening You should do a monthly self testicular exam if you are between 19-16 years old, and we typically do a testicular exam on the yearly physical for this same age group.   Prostate Cancer screening: The recommended prostate cancer screening test is a blood test called the prostate-specific antigen (PSA) test. PSA is a protein that is made in the prostate. As you age, your prostate naturally produces more PSA. Abnormally high PSA levels may be caused by: Prostate cancer. An enlarged prostate that is not caused by cancer (benign prostatic hyperplasia, or BPH). This condition is very common in older men. A  prostate gland infection (prostatitis) or urinary tract infection. Certain medicines such as male hormones (like testosterone) or other medicines that raise testosterone levels. A rectal exam may be done as part of prostate cancer screening to help provide information about the size of your prostate gland. When a rectal exam is performed, it should be done after the PSA level is drawn to avoid any effect on the results.   Skin cancer screening: Check your skin regularly for new changes, growing lesions, or other lesions of concern Come in for evaluation if you have skin lesions of concern.   Lung cancer screening: If you have a greater than 20 pack year history of tobacco use, then you may qualify for lung cancer screening with a chest CT scan.   Please call your insurance company to inquire about coverage for this test.   Pancreatic cancer:  no current screening test is available or routinely recommended. (risk factors: smoking, overweight or obese, diabetes, chronic pancreatitis, work exposure - dry cleaning, metal working, 34yo>, M>F, Sales promotion account executive, family hx/o, hereditary breast, ovarian, melanoma, lynch, peutz-jeghers).  Symptoms: jaundice, dark urine, light color or greasy stools, itchy skin, belly or back pain, weight loss, poor appetite, nause, vomiting, liver enlargement, DVT/blood clots.   We currently don't have screenings for other cancers besides breast, cervical, colon, and lung cancers.  If you have a strong family history of cancer or have other cancer screening concerns, please let me know.  Genetic testing referral is an option for individuals with high cancer risk in the family.  There are some other cancer screenings in development currently.   Bone health: Get at least 150 minutes of aerobic exercise weekly Get weight bearing exercise at least once weekly  Bone density test:  A bone density test is an imaging test that uses a type of X-ray to measure the amount of  calcium and other minerals in your bones. The test may be used to diagnose or screen you for a condition that causes weak or thin bones (osteoporosis), predict your risk for a broken bone (fracture), or determine how well your osteoporosis treatment is working. The bone density test is recommended for females 12 and older, or females or males [id number] if certain risk factors such as thyroid disease, long term use of steroids such as for asthma or rheumatological issues, vitamin D deficiency, estrogen deficiency, family history of osteoporosis, self or family history of fragility fracture in first degree relative.    Heart health: Get at least 150 minutes of aerobic exercise weekly Limit alcohol It is important to maintain a healthy blood pressure and healthy cholesterol numbers  Heart disease screening: Screening for heart disease includes screening for blood pressure, fasting lipids, glucose/diabetes screening, BMI height to weight ratio, reviewed of smoking status, physical activity, and diet.    Goals include blood pressure 120/80 or less, maintaining a healthy lipid/cholesterol profile, preventing diabetes or keeping diabetes numbers under good control, not smoking or using tobacco products, exercising most days per week or at least 150 minutes per week of exercise, and eating healthy variety of fruits and vegetables, healthy oils, and avoiding unhealthy food choices like fried food, fast food, high sugar and high cholesterol foods.    Other tests may possibly include EKG test, CT coronary calcium score, echocardiogram, exercise treadmill stress test.      Vascular disease screening: For higher risk individuals including smokers, diabetics, patients with known heart disease or high blood pressure, kidney disease, and others, screening for vascular disease or atherosclerosis of the arteries is available.  Examples may include carotid ultrasound, abdominal aortic ultrasound, ABI blood flow screening  in the legs, thoracic aorta screening.   Medical care options: I recommend you continue to seek care here first for routine care.  We try really hard to have available appointments Monday through Friday daytime hours for sick visits, acute visits, and physicals.  Urgent care should be used for after hours and weekends for significant issues that cannot wait till the next day.  The emergency department should be used for significant potentially life-threatening emergencies.  The emergency department is expensive, can often have long wait times for less significant concerns, so try to utilize primary care, urgent care, or telemedicine when possible to avoid unnecessary trips to the emergency department.  Virtual visits and telemedicine have been introduced since the pandemic started in 2020, and can be convenient ways to receive medical care.  We offer virtual appointments as well to assist you in a variety of options to seek medical care.   Legal  Take the time to do a last will and testament, Advanced Directives including [place] and Living Will documents.  Don't leave your family with burdens that can be handled ahead of time.   Advanced Directives: I recommend you consider completing a [place] and Living Will.   These documents respect your wishes and help alleviate burdens on your loved ones if you were to become terminally ill or be in a position to need those documents enforced.    You can complete Advanced Directives yourself, have them notarized, then have copies made for our office, for you and for anybody you feel should have them in  safe keeping.  Or, you can have an attorney prepare these documents.   If you haven't updated your Last Will and Testament in a while, it may be worthwhile having an attorney prepare these documents together and save on some costs.       Spiritual and Emotional Health Keeping a healthy spiritual life can help you  better manage your physical health. Your spiritual life can help you to cope with any issues that may arise with your physical health.  Balance can keep [place] healthy and help [place] to recover.  If you are struggling with your spiritual health there are questions that you may want to ask yourself:  What makes me feel most complete? When do I feel most connected to the rest of the world? Where do I find the most inner strength? What am I doing when I feel whole?  Helpful tips: Being in nature. Some people feel very connected and at peace when they are walking outdoors or are outside. Helping others. Some feel the largest sense of wellbeing when they are of service to others. Being of service can take on many forms. It can be doing volunteer work, being kind to strangers, or offering a hand to a friend in need. Gratitude. Some people find they feel the most connected when they remain grateful. They may make lists of all the things they are grateful for or say a thank you out loud for all they have.    Emotional Health Are you in tune with your emotional health?  Check out this link: http://www.bray.com/    Financial Health Make sure you use a budget for your personal finances Make sure you are insured against risks (health insurance, life insurance, auto insurance, etc) Save more, spend less Set financial goals If you need help in this area, good resources include counseling through [employer] or other community resources, have a meeting with a Emergency planning/management officer, and a good resource is the [employer]

## 2022-11-09 NOTE — Progress Notes (Signed)
Subjective:   HPI  Anthony Pena is a 32 y.o. male who presents for Chief Complaint  Patient presents with   fasting cpe    Fasting cpe, no concerns, has not seen GI due to insurance and work schedule    Patient Care Team: Raynell Scott, Leward Quan as PCP - General (Family Medicine)   Concerns: Hypertension - compliant with valsartan.  This works well for him.  Didn't tolerate beta blocker due to fatigue  ADD -he continues on Adderall XR '30mg'$  BID, this seems to work well for him.  Does a berry blend every morning  Fasting today.  Had 3 beers last night  Reviewed their medical, surgical, family, social, medication, and allergy history and updated chart as appropriate.  Allergies  Allergen Reactions   Beta Adrenergic Blockers     Fatigue    Pollen Extract     Past Medical History:  Diagnosis Date   ADD (attention deficit disorder)    Childhood asthma    Dysgraphia    childhood   Dyslexia    childhood   Elevated cholesterol    Hypertension     Current Outpatient Medications on File Prior to Visit  Medication Sig Dispense Refill   amphetamine-dextroamphetamine (ADDERALL XR) 30 MG 24 hr capsule Take 1 capsule (30 mg total) by mouth 2 (two) times daily. 60 capsule 0   valsartan (DIOVAN) 40 MG tablet Take 1 tablet (40 mg total) by mouth daily. 90 tablet 3   No current facility-administered medications on file prior to visit.     Current Outpatient Medications:    amphetamine-dextroamphetamine (ADDERALL XR) 30 MG 24 hr capsule, Take 1 capsule (30 mg total) by mouth 2 (two) times daily., Disp: 60 capsule, Rfl: 0   valsartan (DIOVAN) 40 MG tablet, Take 1 tablet (40 mg total) by mouth daily., Disp: 90 tablet, Rfl: 3  Family History  Problem Relation Age of Onset   Hypertension Mother    Hypertension Father    COPD Father    Heart disease Father        CHF   Diabetes Maternal Grandfather    Cancer Maternal Grandfather        lung,   Prostate cancer Maternal  Grandfather    Cancer Maternal Grandmother        breast   Stroke Maternal Grandmother    Heart disease Paternal Grandmother        CHF   Cancer Paternal Grandfather        stomach   Colon cancer Neg Hx    Esophageal cancer Neg Hx     Past Surgical History:  Procedure Laterality Date   MOUTH SURGERY      Review of Systems  Constitutional:  Negative for chills, fever, malaise/fatigue and weight loss.  HENT:  Negative for congestion, ear pain, hearing loss, sore throat and tinnitus.   Eyes:  Negative for blurred vision, pain and redness.  Respiratory:  Negative for cough, hemoptysis and shortness of breath.   Cardiovascular:  Negative for chest pain, palpitations, orthopnea, claudication and leg swelling.  Gastrointestinal:  Negative for abdominal pain, blood in stool, constipation, diarrhea, nausea and vomiting.  Genitourinary:  Negative for dysuria, flank pain, frequency, hematuria and urgency.  Musculoskeletal:  Negative for falls, joint pain and myalgias.  Skin:  Negative for itching and rash.  Neurological:  Negative for dizziness, tingling, speech change, weakness and headaches.  Endo/Heme/Allergies:  Negative for polydipsia. Does not bruise/bleed easily.  Psychiatric/Behavioral:  Negative for  depression and memory loss. The patient is not nervous/anxious and does not have insomnia.       Objective:  BP 120/70   Pulse 87   Ht '5\' 11"'$  (1.803 m)   Wt 223 lb (101.2 kg)   BMI 31.10 kg/m   Smart watch shows range of pulse 84-110 , resting rate 88-98.  General appearance: alert, no distress, WD/WN, Caucasian male Skin: unremarkable HEENT: normocephalic, conjunctiva/corneas normal, sclerae anicteric, PERRLA, EOMi, nares patent, no discharge or erythema, pharynx normal Oral cavity: MMM, tongue normal, teeth in good repair Neck: supple, no lymphadenopathy, no thyromegaly, no masses, normal ROM, no bruits Chest: non tender, normal shape and expansion Heart: RRR, normal S1,  S2, no murmurs Lungs: CTA bilaterally, no wheezes, rhonchi, or rales Abdomen: +bs, soft, non tender, non distended, no masses, no hepatomegaly, no splenomegaly, no bruits Back: non tender, normal ROM, no scoliosis Musculoskeletal: upper extremities non tender, no obvious deformity, normal ROM throughout, lower extremities non tender, no obvious deformity, normal ROM throughout Extremities: no edema, no cyanosis, no clubbing Pulses: 2+ symmetric, upper and lower extremities, normal cap refill Neurological: alert, oriented x 3, CN2-12 intact, strength normal upper extremities and lower extremities, sensation normal throughout, DTRs 2+ throughout, no cerebellar signs, gait normal Psychiatric: normal affect, behavior normal, pleasant  GU: deferred Rectal: deferred   Assessment and Plan :   Encounter Diagnoses  Name Primary?   Encounter for health maintenance examination in adult Yes   Essential hypertension    High risk medication use    Attention deficit disorder, unspecified hyperactivity presence    Hepatic steatosis     This visit was a preventative care visit, also known as wellness visit or routine physical.   Topics typically include healthy lifestyle, diet, exercise, preventative care, vaccinations, sick and well care, proper use of emergency dept and after hours care, as well as other concerns.     Separate significant issues discussed: ADD - doing fine on current regimen.   He is aware of risks and benefits of medication.  Hypertensin - controlled on current medication  Hepatic steatosis - updated labs today    General Recommendations: Continue to return yearly for your annual wellness and preventative care visits.  This gives [place] a chance to discuss healthy lifestyle, exercise, vaccinations, review your chart record, and perform screenings where appropriate.  I recommend you see your eye doctor yearly for routine vision care.  I recommend you see your dentist yearly for  routine dental care including hygiene visits twice yearly.   Vaccination  Immunization History  Administered Date(s) Administered   DTaP 05/06/1989, 07/09/1989, 09/05/1989, 10/01/1990, 03/07/1994   HIB (PRP-OMP) 09/05/1989, 12/04/1989, 03/12/1990, 06/26/1990   Hepatitis A 05/16/1999, 12/19/1999   Hepatitis B 06/13/2000, 07/30/2000, 04/21/2001   Hepb-cpg 02/16/2021   IPV 05/06/1989, 07/09/1989, 10/01/1990, 03/07/1994   MMR 06/26/1990, 03/07/1994   Meningococcal Conjugate 09/20/2005   PFIZER(Purple Top)SARS-COV-2 Vaccination 11/27/2019, 12/18/2019, 09/21/2020   Td 05/16/1999, 07/26/2004   Tdap 07/26/2004, 08/20/2019    Screening for cancer: Colon cancer screening: Age 12   Testicular cancer screening You should do a monthly self testicular exam if you are between 65-51 years old, and we typically do a testicular exam on the yearly physical for this same age group.   Prostate Cancer screening: The recommended prostate cancer screening test is a blood test called the prostate-specific antigen (PSA) test. PSA is a protein that is made in the prostate. As you age, your prostate naturally produces more PSA. Abnormally high  PSA levels may be caused by: Prostate cancer. An enlarged prostate that is not caused by cancer (benign prostatic hyperplasia, or BPH). This condition is very common in older men. A prostate gland infection (prostatitis) or urinary tract infection. Certain medicines such as male hormones (like testosterone) or other medicines that raise testosterone levels. A rectal exam may be done as part of prostate cancer screening to help provide information about the size of your prostate gland. When a rectal exam is performed, it should be done after the PSA level is drawn to avoid any effect on the results.   Skin cancer screening: Check your skin regularly for new changes, growing lesions, or other lesions of concern Come in for evaluation if you have skin lesions of  concern.   Lung cancer screening: If you have a greater than 20 pack year history of tobacco use, then you may qualify for lung cancer screening with a chest CT scan.   Please call your insurance company to inquire about coverage for this test.   Pancreatic cancer:  no current screening test is available or routinely recommended. (risk factors: smoking, overweight or obese, diabetes, chronic pancreatitis, work exposure - dry cleaning, metal working, 34yo>, M>F, Sales promotion account executive, family hx/o, hereditary breast, ovarian, melanoma, lynch, peutz-jeghers).  Symptoms: jaundice, dark urine, light color or greasy stools, itchy skin, belly or back pain, weight loss, poor appetite, nause, vomiting, liver enlargement, DVT/blood clots.   We currently don't have screenings for other cancers besides breast, cervical, colon, and lung cancers.  If you have a strong family history of cancer or have other cancer screening concerns, please let me know.  Genetic testing referral is an option for individuals with high cancer risk in the family.  There are some other cancer screenings in development currently.   Bone health: Get at least 150 minutes of aerobic exercise weekly Get weight bearing exercise at least once weekly Bone density test:  A bone density test is an imaging test that uses a type of X-ray to measure the amount of calcium and other minerals in your bones. The test may be used to diagnose or screen you for a condition that causes weak or thin bones (osteoporosis), predict your risk for a broken bone (fracture), or determine how well your osteoporosis treatment is working. The bone density test is recommended for females 9 and older, or females or males [id number] if certain risk factors such as thyroid disease, long term use of steroids such as for asthma or rheumatological issues, vitamin D deficiency, estrogen deficiency, family history of osteoporosis, self or family history of fragility fracture in first  degree relative.    Heart health: Get at least 150 minutes of aerobic exercise weekly Limit alcohol It is important to maintain a healthy blood pressure and healthy cholesterol numbers  Heart disease screening: Screening for heart disease includes screening for blood pressure, fasting lipids, glucose/diabetes screening, BMI height to weight ratio, reviewed of smoking status, physical activity, and diet.    Goals include blood pressure 120/80 or less, maintaining a healthy lipid/cholesterol profile, preventing diabetes or keeping diabetes numbers under good control, not smoking or using tobacco products, exercising most days per week or at least 150 minutes per week of exercise, and eating healthy variety of fruits and vegetables, healthy oils, and avoiding unhealthy food choices like fried food, fast food, high sugar and high cholesterol foods.    Other tests may possibly include EKG test, CT coronary calcium score, echocardiogram, exercise treadmill stress test.  Vascular disease screening: For higher risk individuals including smokers, diabetics, patients with known heart disease or high blood pressure, kidney disease, and others, screening for vascular disease or atherosclerosis of the arteries is available.  Examples may include carotid ultrasound, abdominal aortic ultrasound, ABI blood flow screening in the legs, thoracic aorta screening.   Medical care options: I recommend you continue to seek care here first for routine care.  We try really hard to have available appointments Monday through Friday daytime hours for sick visits, acute visits, and physicals.  Urgent care should be used for after hours and weekends for significant issues that cannot wait till the next day.  The emergency department should be used for significant potentially life-threatening emergencies.  The emergency department is expensive, can often have long wait times for less significant concerns, so try to  utilize primary care, urgent care, or telemedicine when possible to avoid unnecessary trips to the emergency department.  Virtual visits and telemedicine have been introduced since the pandemic started in 2020, and can be convenient ways to receive medical care.  We offer virtual appointments as well to assist you in a variety of options to seek medical care.   Legal  Take the time to do a last will and testament, Advanced Directives including [place] and Living Will documents.  Don't leave your family with burdens that can be handled ahead of time.   Advanced Directives: I recommend you consider completing a [place] and Living Will.   These documents respect your wishes and help alleviate burdens on your loved ones if you were to become terminally ill or be in a position to need those documents enforced.    You can complete Advanced Directives yourself, have them notarized, then have copies made for our office, for you and for anybody you feel should have them in safe keeping.  Or, you can have an attorney prepare these documents.   If you haven't updated your Last Will and Testament in a while, it may be worthwhile having an attorney prepare these documents together and save on some costs.       Spiritual and Emotional Health Keeping a healthy spiritual life can help you better manage your physical health. Your spiritual life can help you to cope with any issues that may arise with your physical health.  Balance can keep [place] healthy and help [place] to recover.  If you are struggling with your spiritual health there are questions that you may want to ask yourself:  What makes me feel most complete? When do I feel most connected to the rest of the world? Where do I find the most inner strength? What am I doing when I feel whole?  Helpful tips: Being in nature. Some people feel very connected and at peace when they are walking outdoors or are  outside. Helping others. Some feel the largest sense of wellbeing when they are of service to others. Being of service can take on many forms. It can be doing volunteer work, being kind to strangers, or offering a hand to a friend in need. Gratitude. Some people find they feel the most connected when they remain grateful. They may make lists of all the things they are grateful for or say a thank you out loud for all they have.    Emotional Health Are you in tune with your emotional health?  Check out this link: http://www.bray.com/    Financial Health Make sure you  use a budget for your personal finances Make sure you are insured against risks (health insurance, life insurance, auto insurance, etc) Save more, spend less Set financial goals If you need help in this area, good resources include counseling through [employer] or other community resources, have a meeting with a Emergency planning/management officer, and a good resource is the [employer]    Gareth was seen today for fasting cpe.  Diagnoses and all orders for this visit:  Encounter for health maintenance examination in adult  Essential hypertension  High risk medication use  Attention deficit disorder, unspecified hyperactivity presence  Hepatic steatosis     Follow-up pending labs, yearly for physical

## 2022-11-10 LAB — COMPREHENSIVE METABOLIC PANEL
ALT: 68 IU/L — ABNORMAL HIGH (ref 0–44)
AST: 32 IU/L (ref 0–40)
Albumin/Globulin Ratio: 2 (ref 1.2–2.2)
Albumin: 4.6 g/dL (ref 4.1–5.1)
Alkaline Phosphatase: 72 IU/L (ref 44–121)
BUN/Creatinine Ratio: 11 (ref 9–20)
BUN: 10 mg/dL (ref 6–20)
Bilirubin Total: 0.4 mg/dL (ref 0.0–1.2)
CO2: 21 mmol/L (ref 20–29)
Calcium: 9.6 mg/dL (ref 8.7–10.2)
Chloride: 103 mmol/L (ref 96–106)
Creatinine, Ser: 0.91 mg/dL (ref 0.76–1.27)
Globulin, Total: 2.3 g/dL (ref 1.5–4.5)
Glucose: 95 mg/dL (ref 70–99)
Potassium: 4.5 mmol/L (ref 3.5–5.2)
Sodium: 139 mmol/L (ref 134–144)
Total Protein: 6.9 g/dL (ref 6.0–8.5)
eGFR: 114 mL/min/{1.73_m2} (ref >59)

## 2022-11-10 LAB — CBC WITH DIFFERENTIAL/PLATELET
Basophils Absolute: 0 10*3/uL (ref 0.0–0.2)
Basos: 1 %
EOS (ABSOLUTE): 0.1 10*3/uL (ref 0.0–0.4)
Eos: 1 %
Hematocrit: 47.7 % (ref 37.5–51.0)
Hemoglobin: 16.9 g/dL (ref 13.0–17.7)
Immature Grans (Abs): 0 10*3/uL (ref 0.0–0.1)
Immature Granulocytes: 0 %
Lymphocytes Absolute: 1 10*3/uL (ref 0.7–3.1)
Lymphs: 21 %
MCH: 33.4 pg — ABNORMAL HIGH (ref 26.6–33.0)
MCHC: 35.4 g/dL (ref 31.5–35.7)
MCV: 94 fL (ref 79–97)
Monocytes Absolute: 0.5 10*3/uL (ref 0.1–0.9)
Monocytes: 10 %
Neutrophils Absolute: 3.3 10*3/uL (ref 1.4–7.0)
Neutrophils: 67 %
Platelets: 272 10*3/uL (ref 150–450)
RBC: 5.06 x10E6/uL (ref 4.14–5.80)
RDW: 11.3 % — ABNORMAL LOW (ref 11.6–15.4)
WBC: 5 10*3/uL (ref 3.4–10.8)

## 2022-11-10 LAB — LIPID PANEL
Chol/HDL Ratio: 5.8 ratio — ABNORMAL HIGH (ref 0.0–5.0)
Cholesterol, Total: 207 mg/dL — ABNORMAL HIGH (ref 100–199)
HDL: 36 mg/dL — ABNORMAL LOW (ref >39)
LDL Chol Calc (NIH): 145 mg/dL — ABNORMAL HIGH (ref 0–99)
Triglycerides: 142 mg/dL (ref 0–149)
VLDL Cholesterol Cal: 26 mg/dL (ref 5–40)

## 2022-11-10 LAB — GAMMA GT: GGT: 76 IU/L — ABNORMAL HIGH (ref 0–65)

## 2022-11-12 NOTE — Progress Notes (Signed)
Schedule 76-monthADD med check follow-up  Results sent through My Chart

## 2022-11-13 DIAGNOSIS — F4323 Adjustment disorder with mixed anxiety and depressed mood: Secondary | ICD-10-CM | POA: Diagnosis not present

## 2022-12-06 DIAGNOSIS — F4323 Adjustment disorder with mixed anxiety and depressed mood: Secondary | ICD-10-CM | POA: Diagnosis not present

## 2022-12-07 ENCOUNTER — Telehealth: Payer: Self-pay

## 2022-12-07 ENCOUNTER — Other Ambulatory Visit: Payer: Self-pay | Admitting: Nurse Practitioner

## 2022-12-07 DIAGNOSIS — F988 Other specified behavioral and emotional disorders with onset usually occurring in childhood and adolescence: Secondary | ICD-10-CM

## 2022-12-07 NOTE — Telephone Encounter (Signed)
Pt called to advise his rx for adderall xr was sent in wrong. He said it was supposed to be sent as Take 1 capsule (30 mg total) by mouth 2 (two) times daily with a quantity of 60 instead of Take 1 capsule (30 mg total) by mouth daily.

## 2022-12-07 NOTE — Telephone Encounter (Signed)
Pt notified of rx being sent.

## 2022-12-12 MED ORDER — AMPHETAMINE-DEXTROAMPHET ER 30 MG PO CP24: 30.0000 mg | ORAL_CAPSULE | Freq: Two times a day (BID) | ORAL | 0 refills | Status: DC

## 2022-12-12 NOTE — Addendum Note (Signed)
Addended by: Hymie Gorr, Clarise Cruz E on: 12/12/2022 09:56 AM   Modules accepted: Orders

## 2023-01-17 DIAGNOSIS — F4323 Adjustment disorder with mixed anxiety and depressed mood: Secondary | ICD-10-CM | POA: Diagnosis not present

## 2023-01-21 DIAGNOSIS — W540XXA Bitten by dog, initial encounter: Secondary | ICD-10-CM | POA: Diagnosis not present

## 2023-01-21 DIAGNOSIS — S60519A Abrasion of unspecified hand, initial encounter: Secondary | ICD-10-CM | POA: Diagnosis not present

## 2023-01-21 DIAGNOSIS — J45998 Other asthma: Secondary | ICD-10-CM | POA: Diagnosis not present

## 2023-02-05 DIAGNOSIS — F4323 Adjustment disorder with mixed anxiety and depressed mood: Secondary | ICD-10-CM | POA: Diagnosis not present

## 2023-03-07 ENCOUNTER — Other Ambulatory Visit: Payer: Self-pay | Admitting: Family Medicine

## 2023-03-07 DIAGNOSIS — F988 Other specified behavioral and emotional disorders with onset usually occurring in childhood and adolescence: Secondary | ICD-10-CM

## 2023-03-07 MED ORDER — AMPHETAMINE-DEXTROAMPHET ER 30 MG PO CP24: 30.0000 mg | ORAL_CAPSULE | Freq: Two times a day (BID) | ORAL | 0 refills | Status: DC

## 2023-03-07 NOTE — Telephone Encounter (Signed)
He was supposed to schedule a 6 month ADD med check, and doesn't have anything scheduled.  Please schedule

## 2023-03-07 NOTE — Telephone Encounter (Signed)
Called pt, reached voice mail [email or web address].  Pt need med check for ADD meds. Sent pt msg thru my chart.

## 2023-03-07 NOTE — Telephone Encounter (Signed)
From: Dellie Catholic To: Office of Kristian Covey, [place] Sent: 03/07/2023 6:28 AM EDT Subject: Medication Renewal Request  Refills have been requested for the following medications:   amphetamine-dextroamphetamine (ADDERALL XR) 30 MG 24 hr capsule  Preferred pharmacy: [id number] - [place], [place] - [address] AT [facility] [address] Delivery method: Daryll Drown

## 2023-03-12 DIAGNOSIS — F4323 Adjustment disorder with mixed anxiety and depressed mood: Secondary | ICD-10-CM | POA: Diagnosis not present

## 2023-04-03 ENCOUNTER — Other Ambulatory Visit: Payer: Self-pay | Admitting: Family Medicine

## 2023-04-03 DIAGNOSIS — F988 Other specified behavioral and emotional disorders with onset usually occurring in childhood and adolescence: Secondary | ICD-10-CM

## 2023-04-03 MED ORDER — AMPHETAMINE-DEXTROAMPHET ER 30 MG PO CP24: 30.0000 mg | ORAL_CAPSULE | Freq: Two times a day (BID) | ORAL | 0 refills | Status: DC

## 2023-04-23 DIAGNOSIS — F4323 Adjustment disorder with mixed anxiety and depressed mood: Secondary | ICD-10-CM | POA: Diagnosis not present

## 2023-05-06 DIAGNOSIS — F908 Attention-deficit hyperactivity disorder, other type: Secondary | ICD-10-CM | POA: Diagnosis not present

## 2023-05-07 ENCOUNTER — Other Ambulatory Visit: Payer: Self-pay | Admitting: Medical

## 2023-05-07 DIAGNOSIS — F988 Other specified behavioral and emotional disorders with onset usually occurring in childhood and adolescence: Secondary | ICD-10-CM

## 2023-05-07 MED ORDER — AMPHETAMINE-DEXTROAMPHET ER 30 MG PO CP24: 30.0000 mg | ORAL_CAPSULE | Freq: Two times a day (BID) | ORAL | 0 refills | Status: DC

## 2023-05-28 DIAGNOSIS — F4323 Adjustment disorder with mixed anxiety and depressed mood: Secondary | ICD-10-CM | POA: Diagnosis not present

## 2023-06-06 ENCOUNTER — Other Ambulatory Visit: Payer: Self-pay | Admitting: Medical

## 2023-06-06 DIAGNOSIS — F988 Other specified behavioral and emotional disorders with onset usually occurring in childhood and adolescence: Secondary | ICD-10-CM

## 2023-06-06 MED ORDER — AMPHETAMINE-DEXTROAMPHET ER 30 MG PO CP24: 30.0000 mg | ORAL_CAPSULE | Freq: Two times a day (BID) | ORAL | 0 refills | Status: DC

## 2023-06-25 DIAGNOSIS — F4323 Adjustment disorder with mixed anxiety and depressed mood: Secondary | ICD-10-CM | POA: Diagnosis not present

## 2023-07-08 ENCOUNTER — Telehealth: Payer: Self-pay | Admitting: Medical

## 2023-07-08 ENCOUNTER — Other Ambulatory Visit: Payer: Self-pay | Admitting: Medical

## 2023-07-08 DIAGNOSIS — F909 Attention-deficit hyperactivity disorder, unspecified type: Secondary | ICD-10-CM

## 2023-07-08 DIAGNOSIS — F988 Other specified behavioral and emotional disorders with onset usually occurring in childhood and adolescence: Secondary | ICD-10-CM

## 2023-07-08 MED ORDER — VALSARTAN 40 MG PO TABS: 40.0000 mg | ORAL_TABLET | Freq: Every day | ORAL | 2 refills | Status: DC

## 2023-07-08 MED ORDER — AMPHETAMINE-DEXTROAMPHET ER 30 MG PO CP24: 30.0000 mg | ORAL_CAPSULE | Freq: Two times a day (BID) | ORAL | 0 refills | Status: DC

## 2023-07-08 MED ORDER — AMPHETAMINE-DEXTROAMPHET ER 30 MG PO CP24
30.0000 mg | ORAL_CAPSULE | Freq: Two times a day (BID) | ORAL | 0 refills | Status: DC
Start: 2023-08-08 — End: 2023-10-04

## 2023-07-08 NOTE — Telephone Encounter (Signed)
Pt called about Adderall rx   it was sent to [facility] but they are out, [facility] [place] [facility] [place]   would like rx to be sent there please

## 2023-07-23 DIAGNOSIS — F4323 Adjustment disorder with mixed anxiety and depressed mood: Secondary | ICD-10-CM | POA: Diagnosis not present

## 2023-08-03 ENCOUNTER — Other Ambulatory Visit: Payer: Self-pay | Admitting: Medical

## 2023-08-07 ENCOUNTER — Telehealth: Payer: Self-pay | Admitting: Medical

## 2023-08-07 ENCOUNTER — Other Ambulatory Visit: Payer: Self-pay | Admitting: Medical

## 2023-08-07 DIAGNOSIS — F909 Attention-deficit hyperactivity disorder, unspecified type: Secondary | ICD-10-CM

## 2023-08-07 MED ORDER — AMPHETAMINE-DEXTROAMPHET ER 30 MG PO CP24
30.0000 mg | ORAL_CAPSULE | Freq: Two times a day (BID) | ORAL | 0 refills | Status: DC
Start: 2023-08-07 — End: 2023-09-05

## 2023-08-07 NOTE — Telephone Encounter (Signed)
Pt needs [facility] to [id number] [place], [place] - [address] AT [address]NER OF [address] instead of current pharmacy.

## 2023-08-20 DIAGNOSIS — F4323 Adjustment disorder with mixed anxiety and depressed mood: Secondary | ICD-10-CM | POA: Diagnosis not present

## 2023-09-05 ENCOUNTER — Other Ambulatory Visit: Payer: Self-pay | Admitting: Medical

## 2023-09-05 ENCOUNTER — Telehealth: Payer: Self-pay | Admitting: Family Medicine

## 2023-09-05 DIAGNOSIS — F909 Attention-deficit hyperactivity disorder, unspecified type: Secondary | ICD-10-CM

## 2023-09-05 MED ORDER — AMPHETAMINE-DEXTROAMPHET ER 30 MG PO CP24: 30.0000 mg | ORAL_CAPSULE | Freq: Two times a day (BID) | ORAL | 0 refills | Status: DC

## 2023-09-05 MED ORDER — AMPHETAMINE-DEXTROAMPHET ER 30 MG PO CP24
30.0000 mg | ORAL_CAPSULE | Freq: Two times a day (BID) | ORAL | 0 refills | Status: DC
Start: 2023-09-05 — End: 2023-12-30

## 2023-09-05 NOTE — Telephone Encounter (Signed)
PT req refill Adderall 30 mg [facility].

## 2023-09-24 DIAGNOSIS — F4323 Adjustment disorder with mixed anxiety and depressed mood: Secondary | ICD-10-CM | POA: Diagnosis not present

## 2023-10-04 ENCOUNTER — Other Ambulatory Visit: Payer: Self-pay | Admitting: Medical

## 2023-10-04 ENCOUNTER — Telehealth: Payer: Self-pay | Admitting: Medical

## 2023-10-04 DIAGNOSIS — F909 Attention-deficit hyperactivity disorder, unspecified type: Secondary | ICD-10-CM

## 2023-10-04 MED ORDER — AMPHETAMINE-DEXTROAMPHET ER 30 MG PO CP24
30.0000 mg | ORAL_CAPSULE | Freq: Two times a day (BID) | ORAL | 0 refills | Status: DC
Start: 2023-10-04 — End: 2024-02-04

## 2023-10-04 NOTE — Telephone Encounter (Signed)
Adderall needs to go to [facility]      Not [facility], they are out of stock

## 2023-10-22 DIAGNOSIS — F4323 Adjustment disorder with mixed anxiety and depressed mood: Secondary | ICD-10-CM | POA: Diagnosis not present

## 2023-11-19 DIAGNOSIS — F4323 Adjustment disorder with mixed anxiety and depressed mood: Secondary | ICD-10-CM | POA: Diagnosis not present

## 2023-11-28 ENCOUNTER — Other Ambulatory Visit: Payer: Self-pay | Admitting: Medical

## 2023-11-28 NOTE — Telephone Encounter (Signed)
 Copied from CRM [id number]. Topic: Clinical - Medication Refill >> Nov 28, 2023 10:16 AM Anthony Pena wrote: Most Recent Primary Care Visit:  Provider: Jac Canavan  Department: [facility]  Visit Type: PHYSICAL 45  Date: 11/09/2022  Medication: amphetamine-dextroamphetamine (ADDERALL XR) 30 MG 24 hr capsule  Has the patient contacted their pharmacy? No  Is this the correct pharmacy for this prescription? Yes If no, delete pharmacy and type the correct one.  This is the patient's preferred pharmacy:   [id number] [place], [place] - [address] AT [address]NER OF [facility] [address] [place] [address] Phone: [phone] Fax: [phone]     Has the prescription been filled recently? No  Is the patient out of the medication? Yes  Has the patient been seen for an appointment in the last year OR does the patient have an upcoming appointment? No  Can we respond through MyChart? No  Agent: Please be advised that Rx refills may take up to 3 business days. We ask that you follow-up with your pharmacy.

## 2023-11-28 NOTE — Telephone Encounter (Unsigned)
 Copied from CRM [id number]. Topic: Clinical - Medication Refill >> Nov 28, 2023 10:16 AM Geroge Baseman wrote: Most Recent Primary Care Visit:  Provider: Jac Canavan  Department: [facility]  Visit Type: PHYSICAL 45  Date: 11/09/2022  Medication: amphetamine-dextroamphetamine (ADDERALL XR) 30 MG 24 hr capsule  Has the patient contacted their pharmacy? No  Is this the correct pharmacy for this prescription? Yes If no, delete pharmacy and type the correct one.  This is the patient's preferred pharmacy:   [id number] [place], [place] - [address] AT [address] OF [facility] [address] [place] [address] Phone: [phone] Fax: [phone]     Has the prescription been filled recently? No  Is the patient out of the medication? Yes  Has the patient been seen for an appointment in the last year OR does the patient have an upcoming appointment? No  Can we respond through MyChart? No  Agent: Please be advised that Rx refills may take up to 3 business days. We ask that you follow-up with your pharmacy.

## 2023-11-29 MED ORDER — AMPHETAMINE-DEXTROAMPHET ER 30 MG PO CP24: 30.0000 mg | ORAL_CAPSULE | Freq: Two times a day (BID) | ORAL | 0 refills | Status: DC

## 2023-12-30 ENCOUNTER — Other Ambulatory Visit: Payer: Self-pay | Admitting: Medical

## 2023-12-30 DIAGNOSIS — F909 Attention-deficit hyperactivity disorder, unspecified type: Secondary | ICD-10-CM

## 2023-12-30 NOTE — Telephone Encounter (Signed)
 Copied from CRM [id number]. Topic: Clinical - Medication Refill >> Dec 30, 2023  9:44 AM Carlatta H wrote: Most Recent Primary Care Visit:  Provider: Claudene Crystal  Department: [facility]  Visit Type: PHYSICAL 45  Date: 11/09/2022  Medication: amphetamine-dextroamphetamine (ADDERALL XR) 30 MG 24 hr capsule [[id number]  Has the patient contacted their pharmacy? No (Agent: If no, request that the patient contact the pharmacy for the refill. If patient does not wish to contact the pharmacy document the reason why and proceed with request.) (Agent: If yes, when and what did the pharmacy advise?)  Is this the correct pharmacy for this prescription? Yes If no, delete pharmacy and type the correct one.  This is the patient's preferred pharmacy:  [id number] [place], [place] - [address] [[id number]]   Has the prescription been filled recently? No  Is the patient out of the medication? Yes  Has the patient been seen for an appointment in the last year OR does the patient have an upcoming appointment? Yes  Can we respond through MyChart? Yes  Agent: Please be advised that Rx refills may take up to 3 business days. We ask that you follow-up with your pharmacy.

## 2023-12-30 NOTE — Telephone Encounter (Signed)
 Forwarding to you since Jimmye Moulds is out of the office this week.

## 2023-12-31 MED ORDER — AMPHETAMINE-DEXTROAMPHET ER 30 MG PO CP24: 30.0000 mg | ORAL_CAPSULE | Freq: Two times a day (BID) | ORAL | 0 refills | Status: DC

## 2024-01-28 ENCOUNTER — Other Ambulatory Visit: Payer: Self-pay | Admitting: Medical

## 2024-01-28 DIAGNOSIS — F909 Attention-deficit hyperactivity disorder, unspecified type: Secondary | ICD-10-CM

## 2024-01-28 NOTE — Telephone Encounter (Signed)
 Copied from CRM [id number]. Topic: Clinical - Medication Refill >> Jan 28, 2024  9:36 AM Carlatta H wrote: Medication:  amphetamine -dextroamphetamine  (ADDERALL XR) 30 MG 24 hr capsule [[id number] Has the patient contacted their pharmacy? Yes (Agent: If no, request that the patient contact the pharmacy for the refill. If patient does not wish to contact the pharmacy document the reason why and proceed with request.) (Agent: If yes, when and what did the pharmacy advise?)  This is the patient's preferred pharmacy:   [id number] - [place], [place] - [address] AT [address] [[id number]]    Is this the correct pharmacy for this prescription? Yes If no, delete pharmacy and type the correct one.   Has the prescription been filled recently? No  Is the patient out of the medication? Yes  Has the patient been seen for an appointment in the last year OR does the patient have an upcoming appointment? Yes  Can we respond through MyChart? No  Agent: Please be advised that Rx refills may take up to 3 business days. We ask that you follow-up with your pharmacy.

## 2024-01-31 ENCOUNTER — Other Ambulatory Visit: Payer: Self-pay | Admitting: Medical

## 2024-01-31 DIAGNOSIS — F909 Attention-deficit hyperactivity disorder, unspecified type: Secondary | ICD-10-CM

## 2024-01-31 NOTE — Telephone Encounter (Signed)
 Last Fill: 12/31/23  Last OV: 11/09/22 Next OV: 02/04/24  Routing to provider for review/authorization.   Copied from CRM [id number]. Topic: Clinical - Medication Refill >> Jan 28, 2024  9:36 AM Carlatta H wrote: Medication:  amphetamine -dextroamphetamine  (ADDERALL XR) 30 MG 24 hr capsule [[id number] Has the patient contacted their pharmacy? Yes (Agent: If no, request that the patient contact the pharmacy for the refill. If patient does not wish to contact the pharmacy document the reason why and proceed with request.) (Agent: If yes, when and what did the pharmacy advise?)  This is the patient's preferred pharmacy:   [id number] - [place], [place] - [address] AT [address] [[id number]]    Is this the correct pharmacy for this prescription? Yes If no, delete pharmacy and type the correct one.   Has the prescription been filled recently? No  Is the patient out of the medication? Yes  Has the patient been seen for an appointment in the last year OR does the patient have an upcoming appointment? Yes  Can we respond through MyChart? No  Agent: Please be advised that Rx refills may take up to 3 business days. We ask that you follow-up with your pharmacy. >> Jan 31, 2024  9:44 AM Rosamond Comes wrote: Patient calling asking if this medication has been sent to pharmacy. Patient has an appt on 02/04/24 for physical.  Patient is out of medication  Please call patient [phone] ok to leave a detailed message

## 2024-02-02 ENCOUNTER — Other Ambulatory Visit: Payer: Self-pay | Admitting: Medical

## 2024-02-04 ENCOUNTER — Ambulatory Visit: Payer: PRIVATE HEALTH INSURANCE | Admitting: Medical

## 2024-02-04 VITALS — BP 128/82 | HR 99 | Ht 71.0 in | Wt 228.2 lb

## 2024-02-04 DIAGNOSIS — Z0000 Encounter for general adult medical examination without abnormal findings: Secondary | ICD-10-CM | POA: Diagnosis not present

## 2024-02-04 DIAGNOSIS — Z79899 Other long term (current) drug therapy: Secondary | ICD-10-CM | POA: Diagnosis not present

## 2024-02-04 DIAGNOSIS — K760 Fatty (change of) liver, not elsewhere classified: Secondary | ICD-10-CM | POA: Diagnosis not present

## 2024-02-04 DIAGNOSIS — R7989 Other specified abnormal findings of blood chemistry: Secondary | ICD-10-CM | POA: Diagnosis not present

## 2024-02-04 DIAGNOSIS — F988 Other specified behavioral and emotional disorders with onset usually occurring in childhood and adolescence: Secondary | ICD-10-CM | POA: Diagnosis not present

## 2024-02-04 DIAGNOSIS — I10 Essential (primary) hypertension: Secondary | ICD-10-CM | POA: Insufficient documentation

## 2024-02-04 DIAGNOSIS — F909 Attention-deficit hyperactivity disorder, unspecified type: Secondary | ICD-10-CM

## 2024-02-04 MED ORDER — AMPHETAMINE-DEXTROAMPHET ER 30 MG PO CP24: 30.0000 mg | ORAL_CAPSULE | Freq: Two times a day (BID) | ORAL | 0 refills | Status: DC

## 2024-02-04 MED ORDER — AMPHETAMINE-DEXTROAMPHET ER 30 MG PO CP24: 30.0000 mg | ORAL_CAPSULE | Freq: Two times a day (BID) | ORAL | 0 refills | Status: AC

## 2024-02-04 NOTE — Progress Notes (Signed)
 Subjective:   HPI  Anthony Pena is a 33 y.o. male who presents for Chief Complaint  Patient presents with   Annual Exam    CPE fasting labs, no other issues,     Patient Care Team: Annalysia Willenbring, Newt Barefoot as PCP - General (Family Medicine) GI Dentist   Concerns: Hypertension - compliant with valsartan .  This works well for him.  Didn't tolerate beta blocker due to fatigue  ADD -he continues on Adderall XR 30mg  BID, this seems to work well for him.  No other issues.    Reviewed their medical, surgical, family, social, medication, and allergy history and updated chart as appropriate.  Allergies  Allergen Reactions   Beta Adrenergic Blockers     Fatigue    Pollen Extract     Past Medical History:  Diagnosis Date   ADD (attention deficit disorder)    Childhood asthma    Dysgraphia    childhood   Dyslexia    childhood   Elevated cholesterol    Hepatic steatosis 2021   Hypertension     Current Outpatient Medications on File Prior to Visit  Medication Sig Dispense Refill   valsartan  (DIOVAN ) 40 MG tablet TAKE 1 TABLET BY MOUTH EVERY DAY 90 tablet 0   No current facility-administered medications on file prior to visit.     Current Outpatient Medications:    valsartan  (DIOVAN ) 40 MG tablet, TAKE 1 TABLET BY MOUTH EVERY DAY, Disp: 90 tablet, Rfl: 0   amphetamine -dextroamphetamine  (ADDERALL XR) 30 MG 24 hr capsule, Take 1 capsule (30 mg total) by mouth 2 (two) times daily., Disp: 60 capsule, Rfl: 0   [START ON 03/06/2024] amphetamine -dextroamphetamine  (ADDERALL XR) 30 MG 24 hr capsule, Take 1 capsule (30 mg total) by mouth 2 (two) times daily., Disp: 60 capsule, Rfl: 0   amphetamine -dextroamphetamine  (ADDERALL XR) 30 MG 24 hr capsule, Take 1 capsule (30 mg total) by mouth 2 (two) times daily., Disp: 60 capsule, Rfl: 0   [START ON 04/05/2024] amphetamine -dextroamphetamine  (ADDERALL XR) 30 MG 24 hr capsule, Take 1 capsule (30 mg total) by mouth 2 (two) times daily., Disp:  60 capsule, Rfl: 0  Family History  Problem Relation Age of Onset   Hypertension Mother    Hypertension Father    COPD Father    Heart disease Father        CHF   Diabetes Maternal Grandfather    Cancer Maternal Grandfather        lung,   Prostate cancer Maternal Grandfather    Cancer Maternal Grandmother        breast   Stroke Maternal Grandmother    Heart disease Paternal Grandmother        CHF   Cancer Paternal Grandfather        stomach   Colon cancer Neg Hx    Esophageal cancer Neg Hx     Past Surgical History:  Procedure Laterality Date   MOUTH SURGERY      Review of Systems  Constitutional:  Negative for chills, fever, malaise/fatigue and weight loss.  HENT:  Negative for congestion, ear pain, hearing loss, sore throat and tinnitus.   Eyes:  Negative for blurred vision, pain and redness.  Respiratory:  Negative for cough, hemoptysis and shortness of breath.   Cardiovascular:  Negative for chest pain, palpitations, orthopnea, claudication and leg swelling.  Gastrointestinal:  Negative for abdominal pain, blood in stool, constipation, diarrhea, nausea and vomiting.  Genitourinary:  Negative for dysuria, flank pain, frequency,  hematuria and urgency.  Musculoskeletal:  Negative for falls, joint pain and myalgias.  Skin:  Negative for itching and rash.  Neurological:  Negative for dizziness, tingling, speech change, weakness and headaches.  Endo/Heme/Allergies:  Negative for polydipsia. Does not bruise/bleed easily.  Psychiatric/Behavioral:  Negative for depression and memory loss. The patient is not nervous/anxious and does not have insomnia.       Objective:  BP 128/82   Pulse 99   Ht 5\' 11"  (1.803 m)   Wt 228 lb 3.2 oz (103.5 kg)   BMI 31.83 kg/m   Wt Readings from Last 3 Encounters:  02/04/24 228 lb 3.2 oz (103.5 kg)  11/09/22 223 lb (101.2 kg)  05/17/22 221 lb 6.4 oz (100.4 kg)    General appearance: alert, no distress, WD/WN, Caucasian male Skin:  unremarkable HEENT: normocephalic, conjunctiva/corneas normal, sclerae anicteric, PERRLA, EOMi, nares patent, no discharge or erythema, pharynx normal Oral cavity: MMM, tongue normal, teeth in good repair Neck: supple, no lymphadenopathy, no thyromegaly, no masses, normal ROM, no bruits Chest: non tender, normal shape and expansion Heart: RRR, normal S1, S2, no murmurs Lungs: CTA bilaterally, no wheezes, rhonchi, or rales Abdomen: +bs, soft, non tender, non distended, no masses, no hepatomegaly, no splenomegaly, no bruits Back: non tender, normal ROM, no scoliosis Musculoskeletal: upper extremities non tender, no obvious deformity, normal ROM throughout, lower extremities non tender, no obvious deformity, normal ROM throughout Extremities: no edema, no cyanosis, no clubbing Pulses: 2+ symmetric, upper and lower extremities, normal cap refill Neurological: alert, oriented x 3, CN2-12 intact, strength normal upper extremities and lower extremities, sensation normal throughout, DTRs 2+ throughout, no cerebellar signs, gait normal Psychiatric: normal affect, behavior normal, pleasant  GU: normal male, circ, no mass, no lymphadenopathy, no hernia Rectal: deferred   Assessment and Plan :   Encounter Diagnoses  Name Primary?   Encounter for health maintenance examination in adult    Elevated LFTs    Attention deficit disorder, unspecified type    Hepatic steatosis Yes   High risk medication use    Attention deficit hyperactivity disorder (ADHD), unspecified ADHD type    Essential hypertension, benign     This visit was a preventative care visit, also known as wellness visit or routine physical.   Topics typically include healthy lifestyle, diet, exercise, preventative care, vaccinations, sick and well care, proper use of emergency dept and after hours care, as well as other concerns.     Separate significant issues discussed: ADD - doing fine on current regimen, Adderall XR 30mg  BID.   He  is aware of risks and benefits of medication.  Hypertensin - controlled on current medication, Valsartan  40mg  daily  Hepatic steatosis - updated labs today    General Recommendations: Continue to return yearly for your annual wellness and preventative care visits.  This gives us  a chance to discuss healthy lifestyle, exercise, vaccinations, review your chart record, and perform screenings where appropriate.  I recommend you see your eye doctor yearly for routine vision care.  I recommend you see your dentist yearly for routine dental care including hygiene visits twice yearly.   Vaccination  Immunization History  Administered Date(s) Administered   DTaP 05/06/1989, 07/09/1989, 09/05/1989, 10/01/1990, 03/07/1994   HIB (PRP-OMP) 09/05/1989, 12/04/1989, 03/12/1990, 06/26/1990   Hepatitis A 05/16/1999, 12/19/1999   Hepatitis B 06/13/2000, 07/30/2000, 04/21/2001   Hepb-cpg 02/16/2021   IPV 05/06/1989, 07/09/1989, 10/01/1990, 03/07/1994   MMR 06/26/1990, 03/07/1994   Meningococcal Conjugate 09/20/2005   PFIZER(Purple Top)SARS-COV-2 Vaccination 11/27/2019, 12/18/2019,  09/21/2020   Td 05/16/1999, 07/26/2004   Tdap 07/26/2004, 08/20/2019    Screening for cancer: Colon cancer screening: Age 42   Testicular cancer screening You should do a monthly self testicular exam if you are between 31-13 years old, and we typically do a testicular exam on the yearly physical for this same age group.   Prostate Cancer screening: The recommended prostate cancer screening test is a blood test called the prostate-specific antigen (PSA) test. PSA is a protein that is made in the prostate. As you age, your prostate naturally produces more PSA. Abnormally high PSA levels may be caused by: Prostate cancer. An enlarged prostate that is not caused by cancer (benign prostatic hyperplasia, or BPH). This condition is very common in older men. A prostate gland infection (prostatitis) or urinary tract  infection. Certain medicines such as male hormones (like testosterone) or other medicines that raise testosterone levels. A rectal exam may be done as part of prostate cancer screening to help provide information about the size of your prostate gland. When a rectal exam is performed, it should be done after the PSA level is drawn to avoid any effect on the results.   Skin cancer screening: Check your skin regularly for new changes, growing lesions, or other lesions of concern Come in for evaluation if you have skin lesions of concern.   Lung cancer screening: If you have a greater than 20 pack year history of tobacco use, then you may qualify for lung cancer screening with a chest CT scan.   Please call your insurance company to inquire about coverage for this test.   Pancreatic cancer:  no current screening test is available or routinely recommended. (risk factors: smoking, overweight or obese, diabetes, chronic pancreatitis, work exposure - dry cleaning, metal working, 35yo>, M>F, Tree surgeon, family hx/o, hereditary breast, ovarian, melanoma, lynch, peutz-jeghers).  Symptoms: jaundice, dark urine, light color or greasy stools, itchy skin, belly or back pain, weight loss, poor appetite, nause, vomiting, liver enlargement, DVT/blood clots.   We currently don't have screenings for other cancers besides breast, cervical, colon, and lung cancers.  If you have a strong family history of cancer or have other cancer screening concerns, please let me know.  Genetic testing referral is an option for individuals with high cancer risk in the family.  There are some other cancer screenings in development currently.   Bone health: Get at least 150 minutes of aerobic exercise weekly Get weight bearing exercise at least once weekly Bone density test:  A bone density test is an imaging test that uses a type of X-ray to measure the amount of calcium  and other minerals in your bones. The test may be  used to diagnose or screen you for a condition that causes weak or thin bones (osteoporosis), predict your risk for a broken bone (fracture), or determine how well your osteoporosis treatment is working. The bone density test is recommended for females 65 and older, or females or males <65 if certain risk factors such as thyroid disease, long term use of steroids such as for asthma or rheumatological issues, vitamin D deficiency, estrogen deficiency, family history of osteoporosis, self or family history of fragility fracture in first degree relative.    Heart health: Get at least 150 minutes of aerobic exercise weekly Limit alcohol It is important to maintain a healthy blood pressure and healthy cholesterol numbers  Heart disease screening: Screening for heart disease includes screening for blood pressure, fasting lipids, glucose/diabetes screening, BMI height  to weight ratio, reviewed of smoking status, physical activity, and diet.    Goals include blood pressure 120/80 or less, maintaining a healthy lipid/cholesterol profile, preventing diabetes or keeping diabetes numbers under good control, not smoking or using tobacco products, exercising most days per week or at least 150 minutes per week of exercise, and eating healthy variety of fruits and vegetables, healthy oils, and avoiding unhealthy food choices like fried food, fast food, high sugar and high cholesterol foods.    Other tests may possibly include EKG test, CT coronary calcium  score, echocardiogram, exercise treadmill stress test.      Vascular disease screening: For higher risk individuals including smokers, diabetics, patients with known heart disease or high blood pressure, kidney disease, and others, screening for vascular disease or atherosclerosis of the arteries is available.  Examples may include carotid ultrasound, abdominal aortic ultrasound, ABI blood flow screening in the legs, thoracic aorta screening.   Medical care  options: I recommend you continue to seek care here first for routine care.  We try really hard to have available appointments Monday through Friday daytime hours for sick visits, acute visits, and physicals.  Urgent care should be used for after hours and weekends for significant issues that cannot wait till the next day.  The emergency department should be used for significant potentially life-threatening emergencies.  The emergency department is expensive, can often have long wait times for less significant concerns, so try to utilize primary care, urgent care, or telemedicine when possible to avoid unnecessary trips to the emergency department.  Virtual visits and telemedicine have been introduced since the pandemic started in 2020, and can be convenient ways to receive medical care.  We offer virtual appointments as well to assist you in a variety of options to seek medical care.   Legal  Take the time to do a last will and testament, Advanced Directives including Health Care Power of Attorney and Living Will documents.  Don't leave your family with burdens that can be handled ahead of time.   Advanced Directives: I recommend you consider completing a Health Care Power of Attorney and Living Will.   These documents respect your wishes and help alleviate burdens on your loved ones if you were to become terminally ill or be in a position to need those documents enforced.    You can complete Advanced Directives yourself, have them notarized, then have copies made for our office, for you and for anybody you feel should have them in safe keeping.  Or, you can have an attorney prepare these documents.   If you haven't updated your Last Will and Testament in a while, it may be worthwhile having an attorney prepare these documents together and save on some costs.       Spiritual and Emotional Health Keeping a healthy spiritual life can help you better manage your physical health. Your spiritual life  can help you to cope with any issues that may arise with your physical health.  Balance can keep us  healthy and help us  to recover.  If you are struggling with your spiritual health there are questions that you may want to ask yourself:  What makes me feel most complete? When do I feel most connected to the rest of the world? Where do I find the most inner strength? What am I doing when I feel whole?  Helpful tips: Being in nature. Some people feel very connected and at peace when they are walking outdoors or are outside. Helping  others. Some feel the largest sense of wellbeing when they are of service to others. Being of service can take on many forms. It can be doing volunteer work, being kind to strangers, or offering a hand to a friend in need. Gratitude. Some people find they feel the most connected when they remain grateful. They may make lists of all the things they are grateful for or say a thank you out loud for all they have.    Emotional Health Are you in tune with your emotional health?  Check out this link: http://www.marquez-love.com/    Financial Health Make sure you use a budget for your personal finances Make sure you are insured against risks (health insurance, life insurance, auto insurance, etc) Save more, spend less Set financial goals If you need help in this area, good resources include counseling through [employer] or other community resources, have a meeting with a Social research officer, government, and a good resource is the [employer]    Anthony Pena was seen today for annual exam.  Diagnoses and all orders for this visit:  Hepatic steatosis -     Hepatic function panel -     Lipid panel  Encounter for health maintenance examination in adult -     Hepatic function panel -     Renal Function Panel -     Lipid panel -     CBC with Differential/Platelet -     Iron, TIBC and Ferritin Panel  Elevated LFTs -     Hepatic function panel -      Iron, TIBC and Ferritin Panel  Attention deficit disorder, unspecified type  High risk medication use -     Hepatic function panel -     Renal Function Panel  Attention deficit hyperactivity disorder (ADHD), unspecified ADHD type -     amphetamine -dextroamphetamine  (ADDERALL XR) 30 MG 24 hr capsule; Take 1 capsule (30 mg total) by mouth 2 (two) times daily. -     amphetamine -dextroamphetamine  (ADDERALL XR) 30 MG 24 hr capsule; Take 1 capsule (30 mg total) by mouth 2 (two) times daily. -     amphetamine -dextroamphetamine  (ADDERALL XR) 30 MG 24 hr capsule; Take 1 capsule (30 mg total) by mouth 2 (two) times daily.  Essential hypertension, benign  Other orders -     amphetamine -dextroamphetamine  (ADDERALL XR) 30 MG 24 hr capsule; Take 1 capsule (30 mg total) by mouth 2 (two) times daily.    Follow-up pending labs, yearly for physical

## 2024-02-05 ENCOUNTER — Ambulatory Visit: Payer: Self-pay | Admitting: Medical

## 2024-02-05 ENCOUNTER — Other Ambulatory Visit: Payer: Self-pay | Admitting: Medical

## 2024-02-05 LAB — LIPID PANEL
Chol/HDL Ratio: 5.3 ratio — ABNORMAL HIGH (ref 0.0–5.0)
Cholesterol, Total: 234 mg/dL — ABNORMAL HIGH (ref 100–199)
HDL: 44 mg/dL
LDL Chol Calc (NIH): 165 mg/dL — ABNORMAL HIGH (ref 0–99)
Triglycerides: 138 mg/dL (ref 0–149)
VLDL Cholesterol Cal: 25 mg/dL (ref 5–40)

## 2024-02-05 LAB — CBC WITH DIFFERENTIAL/PLATELET
Basophils Absolute: 0 10*3/uL (ref 0.0–0.2)
Basos: 1 %
EOS (ABSOLUTE): 0.1 10*3/uL (ref 0.0–0.4)
Eos: 2 %
Hematocrit: 46.7 % (ref 37.5–51.0)
Hemoglobin: 16.1 g/dL (ref 13.0–17.7)
Immature Grans (Abs): 0 10*3/uL (ref 0.0–0.1)
Immature Granulocytes: 0 %
Lymphocytes Absolute: 0.9 10*3/uL (ref 0.7–3.1)
Lymphs: 19 %
MCH: 34.3 pg — ABNORMAL HIGH (ref 26.6–33.0)
MCHC: 34.5 g/dL (ref 31.5–35.7)
MCV: 99 fL — ABNORMAL HIGH (ref 79–97)
Monocytes Absolute: 0.5 10*3/uL (ref 0.1–0.9)
Monocytes: 12 %
Neutrophils Absolute: 3 10*3/uL (ref 1.4–7.0)
Neutrophils: 66 %
Platelets: 256 10*3/uL (ref 150–450)
RBC: 4.7 x10E6/uL (ref 4.14–5.80)
RDW: 11.9 % (ref 11.6–15.4)
WBC: 4.5 10*3/uL (ref 3.4–10.8)

## 2024-02-05 LAB — IRON,TIBC AND FERRITIN PANEL
Ferritin: 877 ng/mL — ABNORMAL HIGH (ref 30–400)
Iron Saturation: 42 % (ref 15–55)
Iron: 128 ug/dL (ref 38–169)
Total Iron Binding Capacity: 306 ug/dL (ref 250–450)
UIBC: 178 ug/dL (ref 111–343)

## 2024-02-05 LAB — RENAL FUNCTION PANEL
Albumin: 4.7 g/dL (ref 4.1–5.1)
BUN/Creatinine Ratio: 11 (ref 9–20)
BUN: 9 mg/dL (ref 6–20)
CO2: 20 mmol/L (ref 20–29)
Calcium: 9.6 mg/dL (ref 8.7–10.2)
Chloride: 100 mmol/L (ref 96–106)
Creatinine, Ser: 0.84 mg/dL (ref 0.76–1.27)
Glucose: 98 mg/dL (ref 70–99)
Phosphorus: 2.2 mg/dL — ABNORMAL LOW (ref 2.8–4.1)
Potassium: 4.1 mmol/L (ref 3.5–5.2)
Sodium: 137 mmol/L (ref 134–144)
eGFR: 117 mL/min/{1.73_m2} (ref >59)

## 2024-02-05 LAB — HEPATIC FUNCTION PANEL
ALT: 62 [IU]/L — ABNORMAL HIGH (ref 0–44)
AST: 36 [IU]/L (ref 0–40)
Alkaline Phosphatase: 72 [IU]/L (ref 44–121)
Bilirubin Total: 0.7 mg/dL (ref 0.0–1.2)
Bilirubin, Direct: 0.23 mg/dL (ref 0.00–0.40)
Total Protein: 7.1 g/dL (ref 6.0–8.5)

## 2024-02-05 MED ORDER — ROSUVASTATIN CALCIUM 10 MG PO TABS: 10.0000 mg | ORAL_TABLET | Freq: Every day | ORAL | 3 refills | Status: AC

## 2024-02-05 NOTE — Progress Notes (Signed)
 Results sent through MyChart

## 2024-05-03 ENCOUNTER — Other Ambulatory Visit: Payer: Self-pay | Admitting: Medical

## 2024-05-28 ENCOUNTER — Other Ambulatory Visit: Payer: Self-pay | Admitting: Medical

## 2024-05-28 DIAGNOSIS — F909 Attention-deficit hyperactivity disorder, unspecified type: Secondary | ICD-10-CM

## 2024-05-28 MED ORDER — AMPHETAMINE-DEXTROAMPHET ER 30 MG PO CP24: 30.0000 mg | ORAL_CAPSULE | Freq: Two times a day (BID) | ORAL | 0 refills | Status: DC

## 2024-05-28 NOTE — Telephone Encounter (Signed)
 Copied from CRM [id number]. Topic: Clinical - Medication Refill >> May 28, 2024 10:34 AM Jasmin G wrote: Medication: amphetamine -dextroamphetamine  (ADDERALL XR) 30 MG 24 hr capsule  Has the patient contacted their pharmacy? No (Agent: If no, request that the patient contact the pharmacy for the refill. If patient does not wish to contact the pharmacy document the reason why and proceed with request.) (Agent: If yes, when and what did the pharmacy advise?)  This is the patient's preferred pharmacy:  [id number] - [place], [place] - [address] AT [facility] [address] [place]  [address] Phone: [phone] Fax: [phone]  Is this the correct pharmacy for this prescription? Yes If no, delete pharmacy and type the correct one.   Has the prescription been filled recently? Yes  Is the patient out of the medication? No  Has the patient been seen for an appointment in the last year OR does the patient have an upcoming appointment? Yes  Can we respond through MyChart? Yes  Agent: Please be advised that Rx refills may take up to 3 business days. We ask that you follow-up with your pharmacy.

## 2024-05-29 NOTE — Telephone Encounter (Signed)
 Needs to be resent to [facility] as he is unable to get it at [facility]  Copied from CRM [id number]. Topic: Clinical - Prescription Issue >> May 29, 2024  9:03 AM Willma R wrote: Reason for CRM: Patients prescription for amphetamine -dextroamphetamine  (ADDERALL XR) 30 MG 24 hr capsule was sent to the incorrect pharmacy. Please resend to:  [id number] - [place], [place]  - [address] AT [facility] [address] [place] [address] Phone: [phone] Fax: [phone]  Patient can be reached at [phone]

## 2024-06-01 ENCOUNTER — Other Ambulatory Visit: Payer: Self-pay | Admitting: Medical

## 2024-06-01 DIAGNOSIS — F909 Attention-deficit hyperactivity disorder, unspecified type: Secondary | ICD-10-CM

## 2024-06-01 MED ORDER — AMPHETAMINE-DEXTROAMPHET ER 30 MG PO CP24: 30.0000 mg | ORAL_CAPSULE | Freq: Two times a day (BID) | ORAL | 0 refills | Status: DC

## 2024-06-01 NOTE — Telephone Encounter (Signed)
 Needs to be resent to [facility] as he is unable to get it at [facility] due to out of stock   Copied from CRM #[id number]. Topic: Clinical - Prescription Issue >> May 29, 2024  9:03 AM Willma R wrote: Reason for CRM: Patients prescription for amphetamine -dextroamphetamine  (ADDERALL XR) 30 MG 24 hr capsule was sent to the incorrect pharmacy. Please resend to:  [id number] - [place], [place] - [address] AT [facility] [address] [place] [address] Phone: [phone] Fax: [phone]  Patient can be reached at [phone] >> Jun 01, 2024 10:26 AM Graeme ORN wrote: Patient called back to check status. Out of medication and Pharmacy does not have in stock[facility]

## 2024-07-14 ENCOUNTER — Other Ambulatory Visit: Payer: Self-pay | Admitting: Medical

## 2024-07-14 DIAGNOSIS — F909 Attention-deficit hyperactivity disorder, unspecified type: Secondary | ICD-10-CM

## 2024-07-14 MED ORDER — AMPHETAMINE-DEXTROAMPHET ER 30 MG PO CP24: 30.0000 mg | ORAL_CAPSULE | Freq: Two times a day (BID) | ORAL | 0 refills | Status: DC

## 2024-07-27 ENCOUNTER — Other Ambulatory Visit: Payer: Self-pay | Admitting: Medical

## 2024-08-17 ENCOUNTER — Other Ambulatory Visit: Payer: Self-pay | Admitting: Medical

## 2024-08-17 DIAGNOSIS — F909 Attention-deficit hyperactivity disorder, unspecified type: Secondary | ICD-10-CM

## 2024-08-17 MED ORDER — AMPHETAMINE-DEXTROAMPHET ER 30 MG PO CP24: 30.0000 mg | ORAL_CAPSULE | Freq: Two times a day (BID) | ORAL | 0 refills | Status: DC

## 2024-09-15 ENCOUNTER — Other Ambulatory Visit: Payer: Self-pay | Admitting: Medical

## 2024-09-15 DIAGNOSIS — F909 Attention-deficit hyperactivity disorder, unspecified type: Secondary | ICD-10-CM

## 2024-09-15 MED ORDER — AMPHETAMINE-DEXTROAMPHET ER 30 MG PO CP24: 30.0000 mg | ORAL_CAPSULE | Freq: Two times a day (BID) | ORAL | 0 refills | Status: DC

## 2024-10-20 ENCOUNTER — Other Ambulatory Visit: Payer: Self-pay | Admitting: Medical

## 2024-10-20 DIAGNOSIS — F909 Attention-deficit hyperactivity disorder, unspecified type: Secondary | ICD-10-CM

## 2024-10-20 MED ORDER — AMPHETAMINE-DEXTROAMPHET ER 30 MG PO CP24: 30.0000 mg | ORAL_CAPSULE | Freq: Two times a day (BID) | ORAL | 0 refills | Status: AC

## 2024-10-21 ENCOUNTER — Other Ambulatory Visit: Payer: Self-pay | Admitting: Medical

## 2024-10-21 DIAGNOSIS — F909 Attention-deficit hyperactivity disorder, unspecified type: Secondary | ICD-10-CM

## 2024-10-21 MED ORDER — AMPHETAMINE-DEXTROAMPHET ER 30 MG PO CP24: 30.0000 mg | ORAL_CAPSULE | Freq: Two times a day (BID) | ORAL | 0 refills | Status: AC
